# Patient Record
Sex: Female | Born: 1980 | Race: White | Hispanic: No | Marital: Married | Smoking: Former smoker
Health system: Southern US, Community
[De-identification: ages and names within clinical notes are randomized; demographics above are authoritative.]

## PROBLEM LIST (undated history)

## (undated) ENCOUNTER — Inpatient Hospital Stay (HOSPITAL_COMMUNITY): Payer: Self-pay

## (undated) DIAGNOSIS — J45909 Unspecified asthma, uncomplicated: Secondary | ICD-10-CM

## (undated) DIAGNOSIS — I10 Essential (primary) hypertension: Secondary | ICD-10-CM

## (undated) DIAGNOSIS — T4145XA Adverse effect of unspecified anesthetic, initial encounter: Secondary | ICD-10-CM

## (undated) HISTORY — DX: Unspecified asthma, uncomplicated: J45.909

## (undated) HISTORY — PX: CHOLECYSTECTOMY: SHX55

---

## 1998-06-03 ENCOUNTER — Other Ambulatory Visit: Admission: RE | Admit: 1998-06-03 | Discharge: 1998-06-03 | Payer: Self-pay | Admitting: Gynecology

## 1998-12-15 ENCOUNTER — Other Ambulatory Visit: Admission: RE | Admit: 1998-12-15 | Discharge: 1998-12-15 | Payer: Self-pay | Admitting: Gynecology

## 1999-04-10 ENCOUNTER — Other Ambulatory Visit: Admission: RE | Admit: 1999-04-10 | Discharge: 1999-04-10 | Payer: Self-pay | Admitting: Gynecology

## 1999-10-11 ENCOUNTER — Other Ambulatory Visit: Admission: RE | Admit: 1999-10-11 | Discharge: 1999-10-11 | Payer: Self-pay | Admitting: Obstetrics and Gynecology

## 1999-12-06 ENCOUNTER — Other Ambulatory Visit: Admission: RE | Admit: 1999-12-06 | Discharge: 1999-12-06 | Payer: Self-pay | Admitting: Obstetrics and Gynecology

## 2000-12-04 ENCOUNTER — Other Ambulatory Visit: Admission: RE | Admit: 2000-12-04 | Discharge: 2000-12-04 | Payer: Self-pay | Admitting: Gynecology

## 2001-07-12 ENCOUNTER — Encounter: Payer: Self-pay | Admitting: Emergency Medicine

## 2001-07-12 ENCOUNTER — Emergency Department (HOSPITAL_COMMUNITY): Admission: EM | Admit: 2001-07-12 | Discharge: 2001-07-12 | Payer: Self-pay | Admitting: Emergency Medicine

## 2001-12-11 ENCOUNTER — Other Ambulatory Visit: Admission: RE | Admit: 2001-12-11 | Discharge: 2001-12-11 | Payer: Self-pay | Admitting: Gynecology

## 2003-07-15 ENCOUNTER — Other Ambulatory Visit: Admission: RE | Admit: 2003-07-15 | Discharge: 2003-07-15 | Payer: Self-pay | Admitting: Gynecology

## 2005-05-14 DIAGNOSIS — I10 Essential (primary) hypertension: Secondary | ICD-10-CM

## 2005-05-14 HISTORY — DX: Essential (primary) hypertension: I10

## 2006-03-14 ENCOUNTER — Inpatient Hospital Stay (HOSPITAL_COMMUNITY): Admission: AD | Admit: 2006-03-14 | Discharge: 2006-03-15 | Payer: Self-pay | Admitting: Obstetrics & Gynecology

## 2006-04-12 ENCOUNTER — Inpatient Hospital Stay (HOSPITAL_COMMUNITY): Admission: AD | Admit: 2006-04-12 | Discharge: 2006-04-15 | Payer: Self-pay | Admitting: Obstetrics & Gynecology

## 2006-05-14 DIAGNOSIS — T8859XA Other complications of anesthesia, initial encounter: Secondary | ICD-10-CM

## 2006-05-14 HISTORY — DX: Other complications of anesthesia, initial encounter: T88.59XA

## 2006-06-13 ENCOUNTER — Ambulatory Visit (HOSPITAL_COMMUNITY): Admission: RE | Admit: 2006-06-13 | Discharge: 2006-06-13 | Payer: Self-pay | Admitting: Surgery

## 2006-06-13 IMAGING — RF DG CHOLANGIOGRAM OPERATIVE
1 series · 4 of 4 positions shown · non-contrast
Comparison: none

CLINICAL DATA: Biliary dyskinesia.

[Series 1: run · 4 of 60 frames shown]
[frame 8/60]
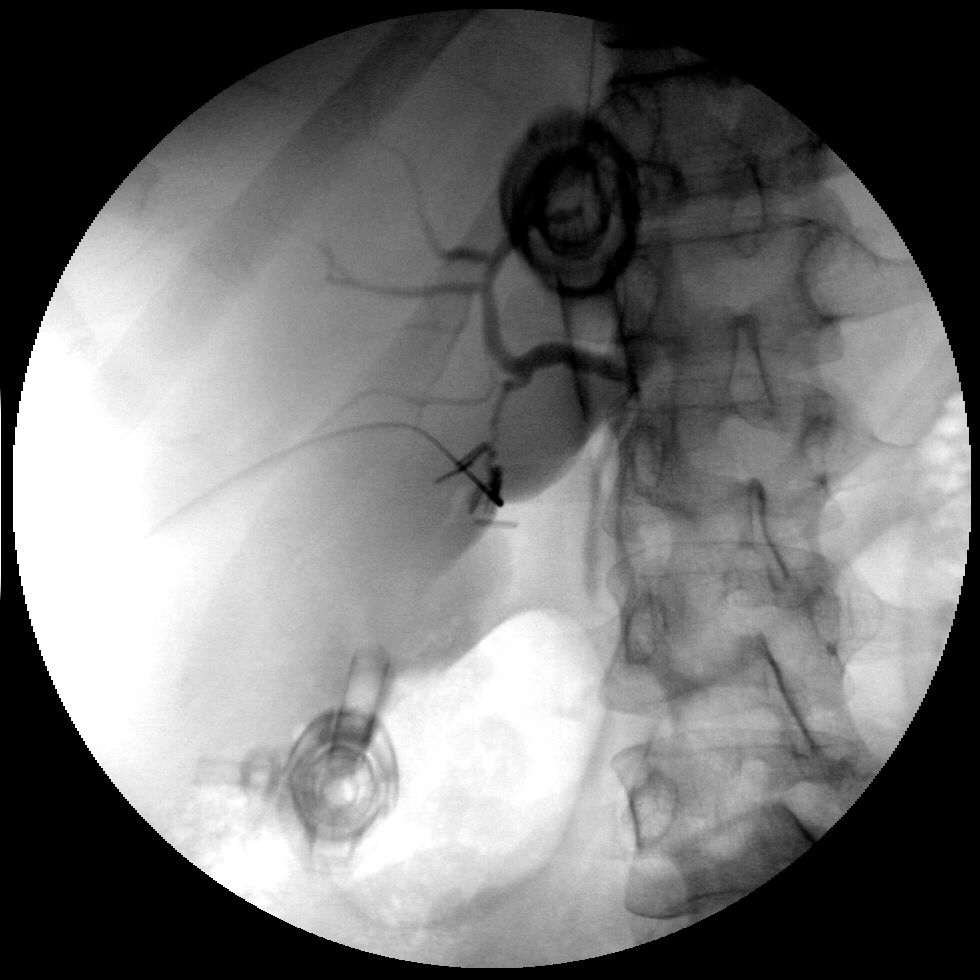
[frame 10/60]
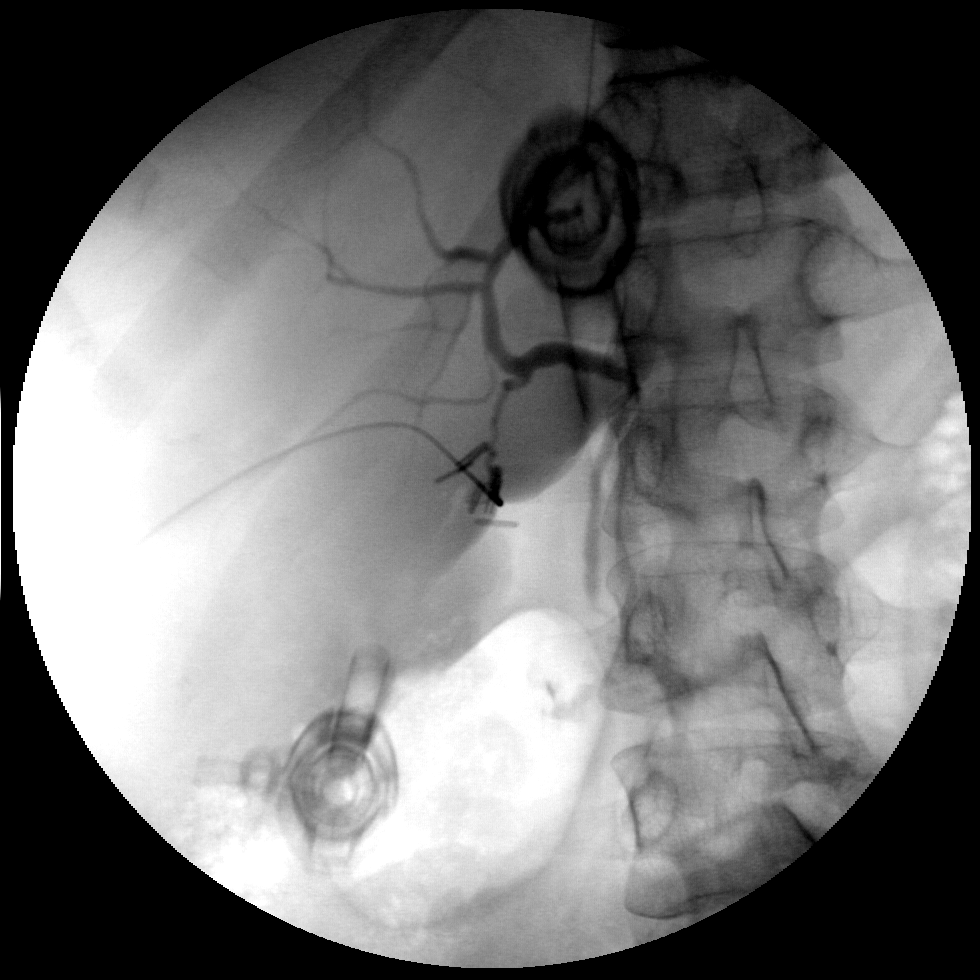
[frame 31/60]
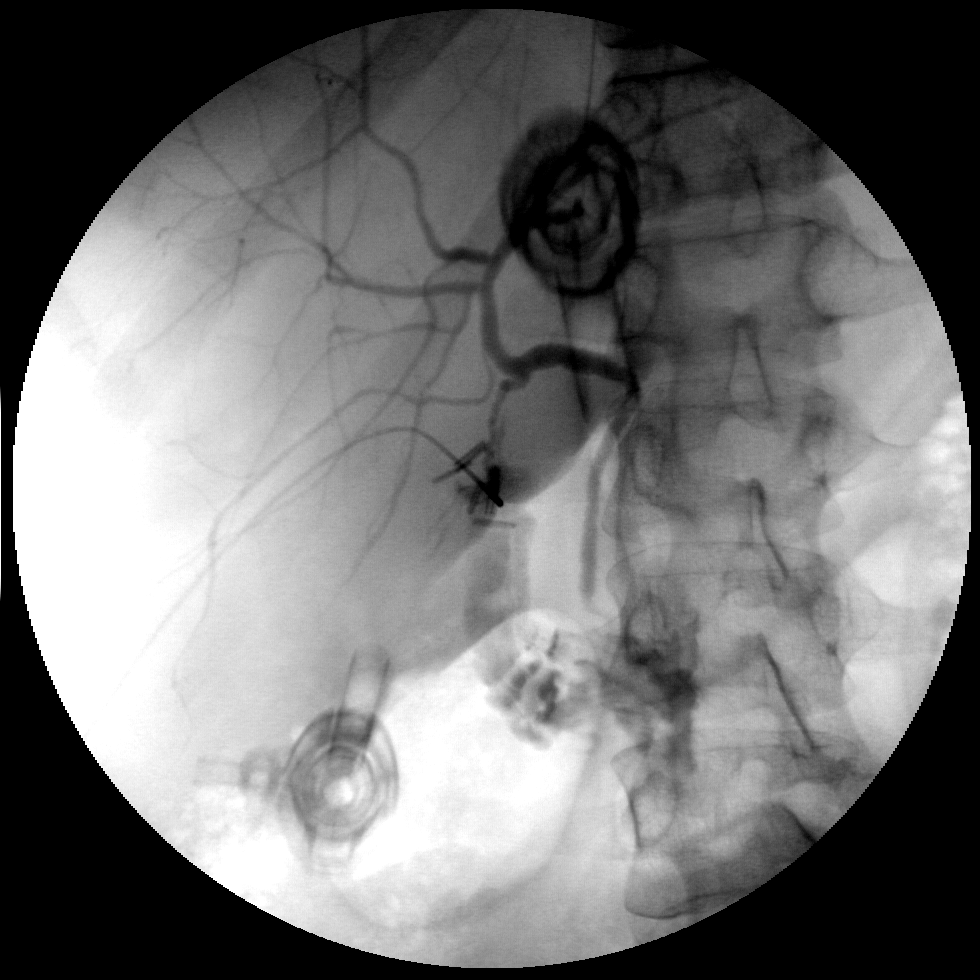
[frame 52/60]
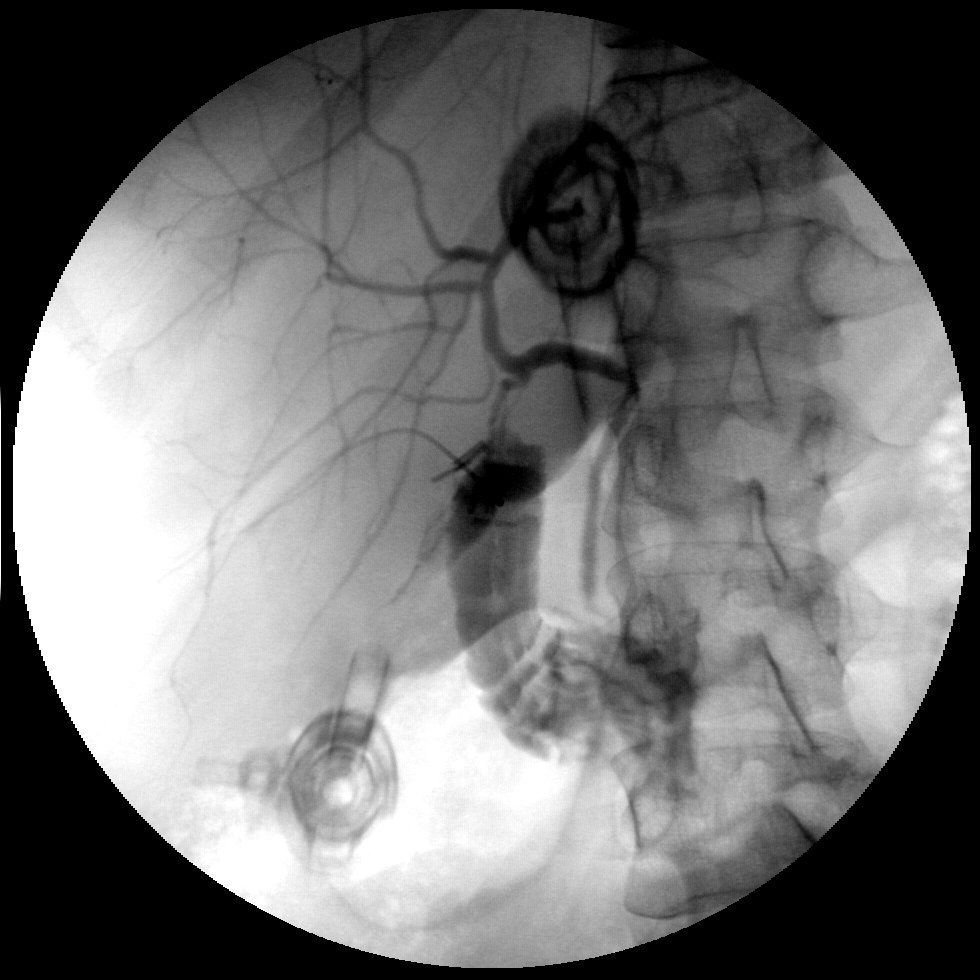

[4 of 4 positions shown; findings below may reference images not displayed]

INTRAOPERATIVE CHOLANGIOGRAM:

60  images from intraoperative C-arm fluoroscopy demonstrate  opacification of
the common bile duct. No filling defects to suggest retained stones. There is
incomplete evaluation of intrahepatic biliary tree, which appears decompressed
centrally. Contrast appears to flow on into decompressed duodenum.
IMPRESSION: 1. Negative for retained common duct stone

## 2010-12-27 ENCOUNTER — Other Ambulatory Visit: Payer: Self-pay | Admitting: Obstetrics & Gynecology

## 2012-03-10 ENCOUNTER — Other Ambulatory Visit (HOSPITAL_COMMUNITY): Payer: Self-pay | Admitting: Obstetrics & Gynecology

## 2012-03-10 DIAGNOSIS — N979 Female infertility, unspecified: Secondary | ICD-10-CM

## 2012-03-17 ENCOUNTER — Ambulatory Visit (HOSPITAL_COMMUNITY): Payer: Self-pay

## 2012-03-18 ENCOUNTER — Ambulatory Visit (HOSPITAL_COMMUNITY)
Admission: RE | Admit: 2012-03-18 | Discharge: 2012-03-18 | Disposition: A | Discharge status: Home / Self Care | Payer: BC Managed Care – PPO | Source: Ambulatory Visit | Attending: Obstetrics & Gynecology | Admitting: Obstetrics & Gynecology

## 2012-03-18 DIAGNOSIS — N979 Female infertility, unspecified: Secondary | ICD-10-CM | POA: Insufficient documentation

## 2012-03-18 MED ORDER — IOHEXOL 300 MG/ML  SOLN
30.0000 mL | Freq: Once | INTRAMUSCULAR | Status: AC | PRN
  Administered 2012-03-18: 30 mL

## 2014-03-08 LAB — OB RESULTS CONSOLE RUBELLA ANTIBODY, IGM: RUBELLA: UNDETERMINED

## 2014-03-08 LAB — OB RESULTS CONSOLE PLATELET COUNT: Platelets: 305 10*3/uL

## 2014-03-08 LAB — OB RESULTS CONSOLE ABO/RH: RH Type: POSITIVE

## 2014-03-08 LAB — OB RESULTS CONSOLE HGB/HCT, BLOOD
HCT: 34 %
Hemoglobin: 11.8 g/dL

## 2014-03-08 LAB — OB RESULTS CONSOLE ANTIBODY SCREEN: Antibody Screen: NEGATIVE

## 2014-03-08 LAB — OB RESULTS CONSOLE RPR: RPR: NONREACTIVE

## 2014-03-08 LAB — OB RESULTS CONSOLE HIV ANTIBODY (ROUTINE TESTING): HIV: NONREACTIVE

## 2014-03-08 LAB — OB RESULTS CONSOLE HEPATITIS B SURFACE ANTIGEN: HEP B S AG: NEGATIVE

## 2014-07-31 ENCOUNTER — Inpatient Hospital Stay (HOSPITAL_COMMUNITY)
Admission: AD | Admit: 2014-07-31 | Discharge: 2014-08-02 | DRG: 782 | Disposition: A | Discharge status: Home / Self Care | Payer: BC Managed Care – PPO | Source: Ambulatory Visit | Attending: Obstetrics | Admitting: Obstetrics

## 2014-07-31 ENCOUNTER — Encounter (HOSPITAL_COMMUNITY): Payer: Self-pay | Admitting: *Deleted

## 2014-07-31 DIAGNOSIS — Z822 Family history of deafness and hearing loss: Secondary | ICD-10-CM | POA: Diagnosis not present

## 2014-07-31 DIAGNOSIS — O09523 Supervision of elderly multigravida, third trimester: Secondary | ICD-10-CM | POA: Diagnosis not present

## 2014-07-31 DIAGNOSIS — Z823 Family history of stroke: Secondary | ICD-10-CM | POA: Diagnosis not present

## 2014-07-31 DIAGNOSIS — O139 Gestational [pregnancy-induced] hypertension without significant proteinuria, unspecified trimester: Secondary | ICD-10-CM | POA: Diagnosis not present

## 2014-07-31 DIAGNOSIS — O133 Gestational [pregnancy-induced] hypertension without significant proteinuria, third trimester: Secondary | ICD-10-CM | POA: Diagnosis present

## 2014-07-31 DIAGNOSIS — O3421 Maternal care for scar from previous cesarean delivery: Secondary | ICD-10-CM | POA: Diagnosis present

## 2014-07-31 DIAGNOSIS — Z3689 Encounter for other specified antenatal screening: Secondary | ICD-10-CM | POA: Insufficient documentation

## 2014-07-31 DIAGNOSIS — Z3A31 31 weeks gestation of pregnancy: Secondary | ICD-10-CM | POA: Diagnosis present

## 2014-07-31 DIAGNOSIS — R030 Elevated blood-pressure reading, without diagnosis of hypertension: Secondary | ICD-10-CM | POA: Diagnosis present

## 2014-07-31 DIAGNOSIS — Z8249 Family history of ischemic heart disease and other diseases of the circulatory system: Secondary | ICD-10-CM | POA: Diagnosis not present

## 2014-07-31 DIAGNOSIS — O169 Unspecified maternal hypertension, unspecified trimester: Secondary | ICD-10-CM | POA: Diagnosis present

## 2014-07-31 HISTORY — DX: Adverse effect of unspecified anesthetic, initial encounter: T41.45XA

## 2014-07-31 LAB — COMPREHENSIVE METABOLIC PANEL
ALBUMIN: 3.2 g/dL — AB (ref 3.5–5.2)
ALT: 16 U/L (ref 0–35)
AST: 17 U/L (ref 0–37)
Alkaline Phosphatase: 85 U/L (ref 39–117)
Anion gap: 8 (ref 5–15)
BUN: 5 mg/dL — ABNORMAL LOW (ref 6–23)
CALCIUM: 8.8 mg/dL (ref 8.4–10.5)
CO2: 23 mmol/L (ref 19–32)
Chloride: 106 mmol/L (ref 96–112)
Creatinine, Ser: 0.44 mg/dL — ABNORMAL LOW (ref 0.50–1.10)
GLUCOSE: 82 mg/dL (ref 70–99)
Potassium: 3.5 mmol/L (ref 3.5–5.1)
SODIUM: 137 mmol/L (ref 135–145)
Total Bilirubin: 0.4 mg/dL (ref 0.3–1.2)
Total Protein: 6.4 g/dL (ref 6.0–8.3)

## 2014-07-31 LAB — PROTEIN / CREATININE RATIO, URINE
Creatinine, Urine: <10 mg/dL
Total Protein, Urine: <6 mg/dL

## 2014-07-31 LAB — URINALYSIS, ROUTINE W REFLEX MICROSCOPIC
Bilirubin Urine: NEGATIVE
Glucose, UA: NEGATIVE mg/dL
Hgb urine dipstick: NEGATIVE
Ketones, ur: NEGATIVE mg/dL
Leukocytes, UA: NEGATIVE
Nitrite: NEGATIVE
Protein, ur: NEGATIVE mg/dL
Specific Gravity, Urine: <1.005 — ABNORMAL LOW (ref 1.005–1.030)
Urobilinogen, UA: 0.2 mg/dL (ref 0.0–1.0)
pH: 7 (ref 5.0–8.0)

## 2014-07-31 LAB — URIC ACID: URIC ACID, SERUM: 3.4 mg/dL (ref 2.4–7.0)

## 2014-07-31 LAB — CBC
HCT: 33.4 % — ABNORMAL LOW (ref 36.0–46.0)
Hemoglobin: 11.3 g/dL — ABNORMAL LOW (ref 12.0–15.0)
MCH: 30.6 pg (ref 26.0–34.0)
MCHC: 33.8 g/dL (ref 30.0–36.0)
MCV: 90.5 fL (ref 78.0–100.0)
PLATELETS: 272 10*3/uL (ref 150–400)
RBC: 3.69 MIL/uL — AB (ref 3.87–5.11)
RDW: 13.8 % (ref 11.5–15.5)
WBC: 10.5 10*3/uL (ref 4.0–10.5)

## 2014-07-31 LAB — LACTATE DEHYDROGENASE: LDH: 131 U/L (ref 94–250)

## 2014-07-31 MED ORDER — FAMOTIDINE 20 MG PO TABS: 20.0000 mg | ORAL_TABLET | Freq: Two times a day (BID) | ORAL | Status: DC | PRN

## 2014-07-31 MED ORDER — PRENATAL MULTIVITAMIN CH
1.0000 | ORAL_TABLET | Freq: Every day | ORAL | Status: DC
  Filled 2014-07-31: qty 1

## 2014-07-31 MED ORDER — CALCIUM CARBONATE ANTACID 500 MG PO CHEW: 2.0000 | CHEWABLE_TABLET | ORAL | Status: DC | PRN

## 2014-07-31 MED ORDER — BETAMETHASONE SOD PHOS & ACET 6 (3-3) MG/ML IJ SUSP
12.0000 mg | INTRAMUSCULAR | Status: AC
  Administered 2014-07-31 – 2014-08-01 (×2): 12 mg via INTRAMUSCULAR
  Filled 2014-07-31 (×2): qty 2

## 2014-07-31 MED ORDER — ACETAMINOPHEN 325 MG PO TABS: 650.0000 mg | ORAL_TABLET | ORAL | Status: DC | PRN

## 2014-07-31 MED ORDER — DOCUSATE SODIUM 100 MG PO CAPS
100.0000 mg | ORAL_CAPSULE | Freq: Every day | ORAL | Status: DC
Start: 2014-08-01 — End: 2014-07-31

## 2014-07-31 MED ORDER — DOCUSATE SODIUM 100 MG PO CAPS
100.0000 mg | ORAL_CAPSULE | Freq: Two times a day (BID) | ORAL | Status: DC | PRN
Start: 2014-07-31 — End: 2014-08-02
  Filled 2014-07-31: qty 1

## 2014-07-31 NOTE — MAU Note (Signed)
Having some problems with BP.  Not on any medications regularly for BP.  Nausea last night.  Headache today, little blurred vision, just not feeling well

## 2014-07-31 NOTE — MAU Provider Note (Signed)
History     CSN: [id number]  Arrival date and time: 07/31/14 1505   First Provider Initiated Contact with Patient 07/31/14 1556      Chief Complaint  Patient presents with  . Hypertension   HPI    Ms. Hayley [name] is a 34 y.o. female G2P1001 at 97w1dwho presents elevated BP readings; she checked her BP at home and it was elevated.   + fetal movement Denies vaginal bleeding or leaking of fluid.  Denies complication with this pregnancy. Desires a repeat cesarean section.   OB History    Gravida Para Term Preterm AB TAB SAB Ectopic Multiple Living   2 1 1  0  0 0 0 0 1      History reviewed. No pertinent past medical history.  Past Surgical History  Procedure Laterality Date  . Cholecystectomy      History reviewed. No pertinent family history.  History  Substance Use Topics  . Smoking status: Never Smoker   . Smokeless tobacco: Not on file  . Alcohol Use: No    Allergies: No Known Allergies  Prescriptions prior to admission  Medication Sig Dispense Refill Last Dose  . acetaminophen (TYLENOL) 160 MG/5ML liquid Take 325 mg by mouth every 4 (four) hours as needed for pain.   07/31/2014 at Unknown time  . Prenatal Vit-Fe Fumarate-FA (PRENATAL MULTIVITAMIN) TABS tablet Take 1 tablet by mouth daily at 12 noon.   07/31/2014 at Unknown time    Results for orders placed or performed during the hospital encounter of 07/31/14 (from the past 48 hour(s))  Urinalysis, Routine w reflex microscopic     Status: Abnormal   Collection Time: 07/31/14  3:15 PM  Result Value Ref Range   Color, Urine YELLOW YELLOW   APPearance CLEAR CLEAR   Specific Gravity, Urine <1.005 (L) 1.005 - 1.030   pH 7.0 5.0 - 8.0   Glucose, UA NEGATIVE NEGATIVE mg/dL   Hgb urine dipstick NEGATIVE NEGATIVE   Bilirubin Urine NEGATIVE NEGATIVE   Ketones, ur NEGATIVE NEGATIVE mg/dL   Protein, ur NEGATIVE NEGATIVE mg/dL   Urobilinogen, UA 0.2 0.0 - 1.0 mg/dL   Nitrite NEGATIVE NEGATIVE   Leukocytes, UA  NEGATIVE NEGATIVE    Comment: MICROSCOPIC NOT DONE ON URINES WITH NEGATIVE PROTEIN, BLOOD, LEUKOCYTES, NITRITE, OR GLUCOSE <1000 mg/dL.  Protein / creatinine ratio, urine     Status: None   Collection Time: 07/31/14  3:15 PM  Result Value Ref Range   Creatinine, Urine <10.00 mg/dL    Comment: REPEATED TO VERIFY   Total Protein, Urine <6.00 mg/dL    Comment: NO NORMAL RANGE ESTABLISHED FOR THIS TEST REPEATED TO VERIFY    Protein Creatinine Ratio        0.00 - 0.15    Comment: RESULT BELOW REPORTABLE RANGE, UNABLE TO CALCULATE.   CBC     Status: Abnormal   Collection Time: 07/31/14  4:10 PM  Result Value Ref Range   WBC 10.5 4.0 - 10.5 K/uL   RBC 3.69 (L) 3.87 - 5.11 MIL/uL   Hemoglobin 11.3 (L) 12.0 - 15.0 g/dL   HCT 33.4 (L) 36.0 - 46.0 %   MCV 90.5 78.0 - 100.0 fL   MCH 30.6 26.0 - 34.0 pg   MCHC 33.8 30.0 - 36.0 g/dL   RDW 13.8 11.5 - 15.5 %   Platelets 272 150 - 400 K/uL  Comprehensive metabolic panel     Status: Abnormal   Collection Time: 07/31/14  4:10 PM  Result Value Ref Range   Sodium 137 135 - 145 mmol/L   Potassium 3.5 3.5 - 5.1 mmol/L   Chloride 106 96 - 112 mmol/L   CO2 23 19 - 32 mmol/L   Glucose, Bld 82 70 - 99 mg/dL   BUN 5 (L) 6 - 23 mg/dL   Creatinine, Ser 0.44 (L) 0.50 - 1.10 mg/dL   Calcium 8.8 8.4 - 10.5 mg/dL   Total Protein 6.4 6.0 - 8.3 g/dL   Albumin 3.2 (L) 3.5 - 5.2 g/dL   AST 17 0 - 37 U/L   ALT 16 0 - 35 U/L   Alkaline Phosphatase 85 39 - 117 U/L   Total Bilirubin 0.4 0.3 - 1.2 mg/dL   GFR calc non Af Amer >90 >90 mL/min   GFR calc Af Amer >90 >90 mL/min    Comment: (NOTE) The eGFR has been calculated using the CKD EPI equation. This calculation has not been validated in all clinical situations. eGFR's persistently <90 mL/min signify possible Chronic Kidney Disease.    Anion gap 8 5 - 15  Uric acid     Status: None   Collection Time: 07/31/14  4:10 PM  Result Value Ref Range   Uric Acid, Serum 3.4 2.4 - 7.0 mg/dL  Lactate  dehydrogenase     Status: None   Collection Time: 07/31/14  4:10 PM  Result Value Ref Range   LDH 131 94 - 250 U/L    Review of Systems  Eyes: Negative for blurred vision.  Respiratory: Negative for sputum production.   Cardiovascular: Negative for chest pain and leg swelling.  Gastrointestinal: Positive for nausea.  Neurological: Positive for headaches.   Physical Exam   Blood pressure 164/97, pulse 83, temperature 97.1 F (36.2 C), temperature source Oral, resp. rate 18, last menstrual period 12/25/2013.    Filed Vitals:   07/31/14 1527  BP: 164/97  Pulse: 83  Temp: 97.1 F (36.2 C)  TempSrc: Oral  Resp: 18    Physical Exam  Constitutional: She is oriented to person, place, and time. She appears well-developed and well-nourished. No distress.  HENT:  Head: Normocephalic.  Eyes: Conjunctivae are normal. Pupils are equal, round, and reactive to light.  Neck: Neck supple.  Cardiovascular: Normal rate and normal heart sounds.   Respiratory: Effort normal.  GI: Soft. There is no tenderness.  Musculoskeletal: Normal range of motion.       Right ankle: She exhibits no swelling.       Left ankle: She exhibits no swelling.  Neurological: She is alert and oriented to person, place, and time. She has normal reflexes.  Negative clonus   Skin: Skin is warm. She is not diaphoretic.  Psychiatric: Her behavior is normal.    MAU Course  Procedures  None  MDM  NST UA Serial BP readings  CBC CMET Uric Acid LDH Protein/creatine ratio  Dr. Carlis Abbott notified of BP readings, and lab results.    Assessment and Plan   A:  Pregnancy induced hypertension; severe range   P:  Admit to Ante Betamethasone   Lezlie Lye, NP 07/31/2014 4:07 PM

## 2014-07-31 NOTE — H&P (Signed)
34 y.o. [id number] @ 373w1d presents with elevated BP.  She notes a mild HA and nausea yesterday.  Generally did not feel well.  Took her BP with her mother's BP cuff and it was "high."  When she still felt poorly today, she had a friend repeat her BP w a manual cuff and it was still elevated. BPs in MAU were 140-160/80-105.  Had a mild HA on admission to MAU, but it has resolved without medication.  Denies scotomata or RUQ pain.  No current nausea.  No h/o GHTN or preE with G1.  ? Mildly elevated BPs in the immediate PP period, but did not require oral antihypertensive therapy, readmission, or PP magnesium.  Otherwise has good fetal movement and no bleeding.    Pregnancy c/b:  1. AMA: NIPT low risk 46XX 2. History of prior cesarean delivery:  Arrest of descent, 6lb 5oz.  Plan is for RCS.   Past Medical History  Diagnosis Date  . Complication of anesthesia 2008    spinal not working, had general anesthesia    Past Surgical History  Procedure Laterality Date  . Cholecystectomy      OB History  Gravida Para Term Preterm AB SAB TAB Ectopic Multiple Living  4 1 1  0 2 2 0 0 0 0    # Outcome Date GA Lbr Len/2nd Weight Sex Delivery Anes PTL Lv  4 Current           3 SAB           2 SAB           1 Term               History   Social History  . Marital Status: Married    Spouse Name: N/A  . Number of Children: N/A  . Years of Education: N/A   Occupational History  . Not on file.   Social History Main Topics  . Smoking status: Never Smoker   . Smokeless tobacco: Not on file  . Alcohol Use: No  . Drug Use: No  . Sexual Activity: Yes   Other Topics Concern  . Not on file   Social History Narrative  . No narrative on file   Review of patient's allergies indicates no known allergies.    Prenatal Transfer Tool  Maternal Diabetes: No Genetic Screening: Normal Maternal Ultrasounds/Referrals: Normal Fetal Ultrasounds or other Referrals:  None Maternal Substance Abuse:   No Significant Maternal Medications:  None Significant Maternal Lab Results: None  ABO, Rh: O/Positive/-- (10/26 0000) Antibody: Negative (10/26 0000) Rubella:   Equivocal RPR: Nonreactive (10/26 0000)  HBsAg: Negative (10/26 0000)  HIV: Non-reactive (10/26 0000)  GBS:   unknown      Filed Vitals:   07/31/14 1745  BP: 145/87  Pulse: 81  Temp:   Resp:      General:  NAD Lungs: CTAB Cardiac: RRR Abdomen:  soft, gravid, no RUQ tenderness Ex:  no edema, DTRs 2+, no clonus SVE:  deferred FHTs:  135, mod var, + 15 x 15 accels, no decels Toco:  quiet   A/P   34 y.o.  [id number] @ 343w1d presents with elevated blood pressure Gestational hypertension vs preeclampsia: I suspect likely GHTN based on UA w neg pro (unable to calc u P:C) and otherwise unremarkable labs.  HA upon presentation was mild and has resolved without treatment.  At this time, BPs are moderately  elevated but not persistently severe.   Plan -Admit to  AP for serial BP monitoring.   -24 hr urine protein / CrCl -Daily CMP/CBC -If BPs > 160/110, will initiate antihypertensive therapy -Oral/IV antihypertensives not indicated at this time as BP not persistently >160 systolic or 110 diastolic -If severe features do not develop, delivery at 37 weeks  -Continuous fetal monitoring overnight.  If BPs remain mild range, will transition to daily NST in AM  -BMZ course now for prematurity. -If delivery is indicated at <32 weeks, plan magnesium sulfate for neuroprotection -NICU consult if delivery becomes imminent. -Growth [place] tomorrow, with f/u growth q3 weeks.    History of cesarean delivery: plan repeat.     [facility] GEFFEL [employer]

## 2014-07-31 NOTE — Plan of Care (Signed)
Problem: Consults Goal: Birthing Suites Patient Information Press F2 to bring up selections list   Pt < [redacted] weeks EGA     

## 2014-08-01 ENCOUNTER — Inpatient Hospital Stay (HOSPITAL_COMMUNITY): Payer: BC Managed Care – PPO

## 2014-08-01 LAB — CBC
HEMATOCRIT: 33.9 % — AB (ref 36.0–46.0)
Hemoglobin: 11.5 g/dL — ABNORMAL LOW (ref 12.0–15.0)
MCH: 30.5 pg (ref 26.0–34.0)
MCHC: 33.9 g/dL (ref 30.0–36.0)
MCV: 89.9 fL (ref 78.0–100.0)
Platelets: 279 10*3/uL (ref 150–400)
RBC: 3.77 MIL/uL — AB (ref 3.87–5.11)
RDW: 13.8 % (ref 11.5–15.5)
WBC: 15 10*3/uL — ABNORMAL HIGH (ref 4.0–10.5)

## 2014-08-01 LAB — COMPREHENSIVE METABOLIC PANEL
ALT: 16 U/L (ref 0–35)
AST: 17 U/L (ref 0–37)
Albumin: 3.1 g/dL — ABNORMAL LOW (ref 3.5–5.2)
Alkaline Phosphatase: 81 U/L (ref 39–117)
Anion gap: 8 (ref 5–15)
BUN: 9 mg/dL (ref 6–23)
CO2: 20 mmol/L (ref 19–32)
Calcium: 9 mg/dL (ref 8.4–10.5)
Chloride: 108 mmol/L (ref 96–112)
Creatinine, Ser: 0.47 mg/dL — ABNORMAL LOW (ref 0.50–1.10)
GFR calc non Af Amer: >90 mL/min (ref >90)
GLUCOSE: 112 mg/dL — AB (ref 70–99)
Potassium: 3.8 mmol/L (ref 3.5–5.1)
Sodium: 136 mmol/L (ref 135–145)
Total Bilirubin: 0.2 mg/dL — ABNORMAL LOW (ref 0.3–1.2)
Total Protein: 6.7 g/dL (ref 6.0–8.3)

## 2014-08-01 LAB — TYPE AND SCREEN
ABO/RH(D): O POS
Antibody Screen: NEGATIVE

## 2014-08-01 LAB — PROTEIN, URINE, 24 HOUR
Collection Interval-UPROT: 24 hours
PROTEIN 24H UR: 247 mg/d — AB (ref <150)
Protein, Urine: 8 mg/dL (ref 5–24)
Urine Total Volume-UPROT: 3085 mL

## 2014-08-01 LAB — ABO/RH: ABO/RH(D): O POS

## 2014-08-01 NOTE — Progress Notes (Signed)
34 y.o. G4P1020 [redacted]w[redacted]d HD#1 admitted for 31 WKS, HIGH BP.  Pt currently stable with no c/o.  Good FM.  Filed Vitals:   07/31/14 1952 07/31/14 2151 07/31/14 2313 08/01/14 0727  BP: 154/91 140/91 155/87 129/74  Pulse: 90 96 86 83  Temp:  98.1 F (36.7 C)  98.2 F (36.8 C)  TempSrc:    Oral  Resp:  Height:      Weight:        Lungs CTA Cor RRR Abd  Soft, gravid, nontender Ex SCDs FHTs  120s, good short term variability, NST R Toco  occ  Results for orders placed or performed during the hospital encounter of 07/31/14 (from the past 24 hour(s))  Urinalysis, Routine w reflex microscopic     Status: Abnormal   Collection Time: 07/31/14  3:15 PM  Result Value Ref Range   Color, Urine YELLOW YELLOW   APPearance CLEAR CLEAR   Specific Gravity, Urine <1.005 (L) 1.005 - 1.030   pH 7.0 5.0 - 8.0   Glucose, UA NEGATIVE NEGATIVE mg/dL   Hgb urine dipstick NEGATIVE NEGATIVE   Bilirubin Urine NEGATIVE NEGATIVE   Ketones, ur NEGATIVE NEGATIVE mg/dL   Protein, ur NEGATIVE NEGATIVE mg/dL   Urobilinogen, UA 0.2 0.0 - 1.0 mg/dL   Nitrite NEGATIVE NEGATIVE   Leukocytes, UA NEGATIVE NEGATIVE  Protein / creatinine ratio, urine     Status: None   Collection Time: 07/31/14  3:15 PM  Result Value Ref Range   Creatinine, Urine <10.00 mg/dL   Total Protein, Urine <6.00 mg/dL   Protein Creatinine Ratio        0.00 - 0.15  CBC     Status: Abnormal   Collection Time: 07/31/14  4:10 PM  Result Value Ref Range   WBC 10.5 4.0 - 10.5 K/uL   RBC 3.69 (L) 3.87 - 5.11 MIL/uL   Hemoglobin 11.3 (L) 12.0 - 15.0 g/dL   HCT 16.1 (L) 09.6 - 04.5 %   MCV 90.5 78.0 - 100.0 fL   MCH 30.6 26.0 - 34.0 pg   MCHC 33.8 30.0 - 36.0 g/dL   RDW 40.9 81.1 - 91.4 %   Platelets 272 150 - 400 K/uL  Comprehensive metabolic panel     Status: Abnormal   Collection Time: 07/31/14  4:10 PM  Result Value Ref Range   Sodium 137 135 - 145 mmol/L   Potassium 3.5 3.5 - 5.1 mmol/L   Chloride 106 96 - 112 mmol/L    CO2 23 19 - 32 mmol/L   Glucose, Bld 82 70 - 99 mg/dL   BUN 5 (L) 6 - 23 mg/dL   Creatinine, Ser 7.82 (L) 0.50 - 1.10 mg/dL   Calcium 8.8 8.4 - 95.6 mg/dL   Total Protein 6.4 6.0 - 8.3 g/dL   Albumin 3.2 (L) 3.5 - 5.2 g/dL   AST 17 0 - 37 U/L   ALT 16 0 - 35 U/L   Alkaline Phosphatase 85 39 - 117 U/L   Total Bilirubin 0.4 0.3 - 1.2 mg/dL   GFR calc non Af Amer >90 >90 mL/min   GFR calc Af Amer >90 >90 mL/min   Anion gap 8 5 - 15  Uric acid     Status: None   Collection Time: 07/31/14  4:10 PM  Result Value Ref Range   Uric Acid, Serum 3.4 2.4 - 7.0 mg/dL  Lactate dehydrogenase     Status: None   Collection Time: 07/31/14  4:10 PM  Result Value Ref Range   LDH 131 94 - 250 U/L  Comprehensive metabolic panel     Status: Abnormal   Collection Time: 08/01/14  6:17 AM  Result Value Ref Range   Sodium 136 135 - 145 mmol/L   Potassium 3.8 3.5 - 5.1 mmol/L   Chloride 108 96 - 112 mmol/L   CO2 20 19 - 32 mmol/L   Glucose, Bld 112 (H) 70 - 99 mg/dL   BUN 9 6 - 23 mg/dL   Creatinine, Ser 1.020.47 (L) 0.50 - 1.10 mg/dL   Calcium 9.0 8.4 - 72.510.5 mg/dL   Total Protein 6.7 6.0 - 8.3 g/dL   Albumin 3.1 (L) 3.5 - 5.2 g/dL   AST 17 0 - 37 U/L   ALT 16 0 - 35 U/L   Alkaline Phosphatase 81 39 - 117 U/L   Total Bilirubin 0.2 (L) 0.3 - 1.2 mg/dL   GFR calc non Af Amer >90 >90 mL/min   GFR calc Af Amer >90 >90 mL/min   Anion gap 8 5 - 15  CBC     Status: Abnormal   Collection Time: 08/01/14  6:17 AM  Result Value Ref Range   WBC 15.0 (H) 4.0 - 10.5 K/uL   RBC 3.77 (L) 3.87 - 5.11 MIL/uL   Hemoglobin 11.5 (L) 12.0 - 15.0 g/dL   HCT 36.633.9 (L) 44.036.0 - 34.746.0 %   MCV 89.9 78.0 - 100.0 fL   MCH 30.5 26.0 - 34.0 pg   MCHC 33.9 30.0 - 36.0 g/dL   RDW 42.513.8 95.611.5 - 38.715.5 %   Platelets 279 150 - 400 K/uL  Type and screen     Status: None   Collection Time: 08/01/14  6:17 AM  Result Value Ref Range   ABO/RH(D) O POS    Antibody Screen NEG    Sample Expiration 08/04/2014    [name]:  HD#1  [id number] with  PIH.  P: Pt has no signs of preeclampsia. BPs mostly stable with bedrest.  Will continue to observe and finish BMZ course.  MFM consult NOT yet ordered- may desire tomorrow.  US pending for EFW and AFI.  Hayley [name]

## 2014-08-01 NOTE — Progress Notes (Signed)
US vtx, 3#14, 40%ile with AFI 18. 24 hour urine also in progress- up at 7 pm tonight.

## 2014-08-02 ENCOUNTER — Inpatient Hospital Stay (HOSPITAL_COMMUNITY): Payer: BC Managed Care – PPO

## 2014-08-02 DIAGNOSIS — Z3689 Encounter for other specified antenatal screening: Secondary | ICD-10-CM | POA: Insufficient documentation

## 2014-08-02 DIAGNOSIS — Z3A31 31 weeks gestation of pregnancy: Secondary | ICD-10-CM | POA: Insufficient documentation

## 2014-08-02 DIAGNOSIS — O133 Gestational [pregnancy-induced] hypertension without significant proteinuria, third trimester: Secondary | ICD-10-CM | POA: Insufficient documentation

## 2014-08-02 LAB — CREATININE CLEARANCE, URINE, 24 HOUR
CREAT CLEAR: 197 mL/min — AB (ref 75–115)
CREATININE 24H UR: 1332 mg/d (ref 700–1800)
Collection Interval-CRCL: 24 hours
Creatinine, Urine: 43.18 mg/dL
Urine Total Volume-CRCL: 3085 mL

## 2014-08-02 LAB — CBC
HCT: 32.3 % — ABNORMAL LOW (ref 36.0–46.0)
HEMOGLOBIN: 11.2 g/dL — AB (ref 12.0–15.0)
MCH: 31.4 pg (ref 26.0–34.0)
MCHC: 34.7 g/dL (ref 30.0–36.0)
MCV: 90.5 fL (ref 78.0–100.0)
Platelets: 285 10*3/uL (ref 150–400)
RBC: 3.57 MIL/uL — AB (ref 3.87–5.11)
RDW: 14 % (ref 11.5–15.5)
WBC: 17.8 10*3/uL — ABNORMAL HIGH (ref 4.0–10.5)

## 2014-08-02 LAB — COMPREHENSIVE METABOLIC PANEL
ALT: 14 U/L (ref 0–35)
AST: 15 U/L (ref 0–37)
Albumin: 3.1 g/dL — ABNORMAL LOW (ref 3.5–5.2)
Alkaline Phosphatase: 73 U/L (ref 39–117)
Anion gap: 9 (ref 5–15)
BUN: 8 mg/dL (ref 6–23)
CALCIUM: 8.7 mg/dL (ref 8.4–10.5)
CHLORIDE: 107 mmol/L (ref 96–112)
CO2: 20 mmol/L (ref 19–32)
Creatinine, Ser: 0.44 mg/dL — ABNORMAL LOW (ref 0.50–1.10)
GFR calc Af Amer: >90 mL/min (ref >90)
Glucose, Bld: 121 mg/dL — ABNORMAL HIGH (ref 70–99)
Potassium: 3.8 mmol/L (ref 3.5–5.1)
Sodium: 136 mmol/L (ref 135–145)
Total Bilirubin: 0.1 mg/dL — ABNORMAL LOW (ref 0.3–1.2)
Total Protein: 6.3 g/dL (ref 6.0–8.3)

## 2014-08-02 MED ORDER — NIFEDIPINE ER OSMOTIC RELEASE 30 MG PO TB24: 30.0000 mg | ORAL_TABLET | Freq: Every day | ORAL | Status: DC

## 2014-08-02 NOTE — Discharge Instructions (Signed)
Hypertension During Pregnancy °Hypertension, or high blood pressure, is when there is extra pressure inside your blood vessels that carry blood from the heart to the rest of your body (arteries). It can happen at any time in life, including pregnancy. Hypertension during pregnancy can cause problems for you and your baby. Your baby might not weigh as much as he or she should at birth or might be born early (premature). Very bad cases of hypertension during pregnancy can be life-threatening.  °Different types of hypertension can occur during pregnancy. These include: °· Chronic hypertension. This happens when a woman has hypertension before pregnancy and it continues during pregnancy. °· Gestational hypertension. This is when hypertension develops during pregnancy. °· Preeclampsia or toxemia of pregnancy. This is a very serious type of hypertension that develops only during pregnancy. It affects the whole body and can be very dangerous for both mother and baby.   °Gestational hypertension and preeclampsia usually go away after your baby is born. Your blood pressure will likely stabilize within 6 weeks. Women who have hypertension during pregnancy have a greater chance of developing hypertension later in life or with future pregnancies. °RISK FACTORS °There are certain factors that make it more likely for you to develop hypertension during pregnancy. These include: °· Having hypertension before pregnancy. °· Having hypertension during a previous pregnancy. °· Being overweight. °· Being older than 40 years. °· Being pregnant with more than one baby. °· Having diabetes or kidney problems. °SIGNS AND SYMPTOMS °Chronic and gestational hypertension rarely cause symptoms. Preeclampsia has symptoms, which may include: °· Increased protein in your urine. Your health care provider will check for this at every prenatal visit. °· Swelling of your hands and face. °· Rapid weight gain. °· Headaches. °· Visual changes. °· Being  bothered by light. °· Abdominal pain, especially in the upper right area. °· Chest pain. °· Shortness of breath. °· Increased reflexes. °· Seizures. These occur with a more severe form of preeclampsia, called eclampsia. °DIAGNOSIS  °You may be diagnosed with hypertension during a regular prenatal exam. At each prenatal visit, you may have: °· Your blood pressure checked. °· A urine test to check for protein in your urine. °The type of hypertension you are diagnosed with depends on when you developed it. It also depends on your specific blood pressure reading. °· Developing hypertension before 20 weeks of pregnancy is consistent with chronic hypertension. °· Developing hypertension after 20 weeks of pregnancy is consistent with gestational hypertension. °· Hypertension with increased urinary protein is diagnosed as preeclampsia. °· Blood pressure measurements that stay above 160 systolic or 110 diastolic are a sign of severe preeclampsia. °TREATMENT °Treatment for hypertension during pregnancy varies. Treatment depends on the type of hypertension and how serious it is. °· If you take medicine for chronic hypertension, you may need to switch medicines. °¨ Medicines called ACE inhibitors should not be taken during pregnancy. °¨ Low-dose aspirin may be suggested for women who have risk factors for preeclampsia. °· If you have gestational hypertension, you may need to take a blood pressure medicine that is safe during pregnancy. Your health care provider will recommend the correct medicine. °· If you have severe preeclampsia, you may need to be in the hospital. Health care providers will watch you and your baby very closely. You also may need to take medicine called magnesium sulfate to prevent seizures and lower blood pressure. °· Sometimes, an early delivery is needed. This may be the case if the condition worsens. It would be   done to protect you and your baby. The only cure for preeclampsia is delivery. °· Your health  care provider may recommend that you take one low-dose aspirin (81 mg) each day to help prevent high blood pressure during your pregnancy if you are at risk for preeclampsia. You may be at risk for preeclampsia if: °¨ You had preeclampsia or eclampsia during a previous pregnancy. °¨ Your baby did not grow as expected during a previous pregnancy. °¨ You experienced preterm birth with a previous pregnancy. °¨ You experienced a separation of the placenta from the uterus (placental abruption) during a previous pregnancy. °¨ You experienced the loss of your baby during a previous pregnancy. °¨ You are pregnant with more than one baby. °¨ You have other medical conditions, such as diabetes or an autoimmune disease. °HOME CARE INSTRUCTIONS °· Schedule and keep all of your regular prenatal care appointments. This is important. °· Take medicines only as directed by your health care provider. Tell your health care provider about all medicines you take. °· Eat as little salt as possible. °· Get regular exercise. °· Do not drink alcohol. °· Do not use tobacco products. °· Do not drink products with caffeine. °· Lie on your left side when resting. °SEEK IMMEDIATE MEDICAL CARE IF: °· You have severe abdominal pain. °· You have sudden swelling in your hands, ankles, or face. °· You gain 4 pounds (1.8 kg) or more in 1 week. °· You vomit repeatedly. °· You have vaginal bleeding. °· You do not feel your baby moving as much. °· You have a headache. °· You have blurred or double vision. °· You have muscle twitching or spasms. °· You have shortness of breath. °· You have blue fingernails or lips. °· You have blood in your urine. °MAKE SURE YOU: °· Understand these instructions. °· Will watch your condition. °· Will get help right away if you are not doing well or get worse. °Document Released: 01/16/2011 Document Revised: 09/14/2013 Document Reviewed: 11/27/2012 °Exit[employer]® Patient Information ©2015 [employer]. This information is not  intended to replace advice given to you by your health care provider. Make sure you discuss any questions you have with your health care provider. ° °Preeclampsia and Eclampsia °Preeclampsia is a serious condition that develops only during pregnancy. It is also called toxemia of pregnancy. This condition causes high blood pressure along with other symptoms, such as swelling and headaches. These may develop as the condition gets worse. Preeclampsia may occur 20 weeks or later into your pregnancy.  °Diagnosing and treating preeclampsia early is very important. If not treated early, it can cause serious problems for you and your baby. One problem it can lead to is eclampsia, which is a condition that causes muscle jerking or shaking (convulsions) in the mother. Delivering your baby is the best treatment for preeclampsia or eclampsia.  °RISK FACTORS °The cause of preeclampsia is not known. You may be more likely to develop preeclampsia if you have certain risk factors. These include:  °· Being pregnant for the first time. °· Having preeclampsia in a past pregnancy. °· Having a family history of preeclampsia. °· Having high blood pressure. °· Being pregnant with twins or triplets. °· Being 35 or older. °· Being African American. °· Having kidney disease or diabetes. °· Having medical conditions such as lupus or blood diseases. °· Being very overweight (obese). °SIGNS AND SYMPTOMS  °The earliest signs of preeclampsia are: °· High blood pressure. °· Increased protein in your urine. Your health care   provider will check for this at every prenatal visit. °Other symptoms that can develop include:  °· Severe headaches. °· Sudden weight gain. °· Swelling of your hands, face, legs, and feet. °· Feeling sick to your stomach (nauseous) and throwing up (vomiting). °· Vision problems (blurred or double vision). °· Numbness in your face, arms, legs, and feet. °· Dizziness. °· Slurred speech. °· Sensitivity to bright  lights. °· Abdominal pain. °DIAGNOSIS  °There are no screening tests for preeclampsia. Your health care provider will ask you about symptoms and check for signs of preeclampsia during your prenatal visits. You may also have tests, including: °· Urine testing. °· Blood testing. °· Checking your baby's heart rate. °· Checking the health of your baby and your placenta using images created with sound waves (ultrasound). °TREATMENT  °You can work out the best treatment approach together with your health care provider. It is very important to keep all prenatal appointments. If you have an increased risk of preeclampsia, you may need more frequent prenatal exams. °· Your health care provider may prescribe bed rest. °· You may have to eat as little salt as possible. °· You may need to take medicine to lower your blood pressure if the condition does not respond to more conservative measures. °· You may need to stay in the hospital if your condition is severe. There, treatment will focus on controlling your blood pressure and fluid retention. You may also need to take medicine to prevent seizures. °· If the condition gets worse, your baby may need to be delivered early to protect you and the baby. You may have your labor started with medicine (be induced), or you may have a cesarean delivery. °· Preeclampsia usually goes away after the baby is born. °HOME CARE INSTRUCTIONS  °· Only take over-the-counter or prescription medicines as directed by your health care provider. °· Lie on your left side while resting. This keeps pressure off your baby. °· Elevate your feet while resting. °· Get regular exercise. Ask your health care provider what type of exercise is safe for you. °· Avoid caffeine and alcohol. °· Do not smoke. °· Drink 6-8 glasses of water every day. °· Eat a balanced diet that is low in salt. Do not add salt to your food. °· Avoid stressful situations as much as possible. °· Get plenty of rest and sleep. °· Keep all  prenatal appointments and tests as scheduled. °SEEK MEDICAL CARE IF: °· You are gaining more weight than expected. °· You have any headaches, abdominal pain, or nausea. °· You are bruising more than usual. °· You feel dizzy or light-headed. °SEEK IMMEDIATE MEDICAL CARE IF:  °· You develop sudden or severe swelling anywhere in your body. This usually happens in the legs. °· You gain 5 lb (2.3 kg) or more in a week. °· You have a severe headache, dizziness, problems with your vision, or confusion. °· You have severe abdominal pain. °· You have lasting nausea or vomiting. °· You have a seizure. °· You have trouble moving any part of your body. °· You develop numbness in your body. °· You have trouble speaking. °· You have any abnormal bleeding. °· You develop a stiff neck. °· You pass out. °MAKE SURE YOU:  °· Understand these instructions. °· Will watch your condition. °· Will get help right away if you are not doing well or get worse. °Document Released: 04/27/2000 Document Revised: 05/05/2013 Document Reviewed: 02/20/2013 °ExitCare® Patient Information ©2015 [employer]. This information is not   intended to replace advice given to you by your health care provider. Make sure you discuss any questions you have with your health care provider. ° °

## 2014-08-02 NOTE — Progress Notes (Signed)
RN spoke w/MD. Per MD, pt okay to discharge following MFM MD consult.

## 2014-08-02 NOTE — Consult Note (Signed)
Maternal Fetal Medicine Consultation  Requesting Provider(s): Essie Hart, MD   Reason for consultation: Gestational hypertension, recommendations for management  HPI: Hayley [name]  Is a 34 yo [id number], EDD 10/01/2014 who is currently at 31w 3d who was recently admitted due to elevated blood pressures- seen in consultation for management recommendations.  Hayley [name] denies any history of chronic hypertension outside of pregnancy - noted during her clinic visits last week to have some elevated blood pressures.  She was admitted over the weekend for a course of betamethasone and observation.  Her preeclampsia labs were within normal limits.  Her 24-hr urine protein returned at 247 mg/24 hrs.  Admission ultrasound showed an estimated fetal weight at the 54th %tile with normal amniotic fluid volume.  At the time of admission, she had some intermittent severe range blood pressures (150-160/105-115) that have since improved on bedrest and she has not required any antihypertensive medications.  Over the last 12-24 hrs, her blood pressures have mostly been it the 135-140/70-90 range.  She had some headaches on admission that have since resolved.  She denies visual changes or RUQ pain.  The fetus is active.  Her prenatal course has otherwise been uncomplicated.  Hayley [name] reports that she had some elevated blood pressures following the delivery of her last child, but did not require admission or antihypertensive medications.  Her past OB history is remarkable for a previous term C-section due to non-reassuring fetal status and two early SABs.  OB History: OB History    Gravida Para Term Preterm AB TAB SAB Ectopic Multiple Living   0 2 0 2 0 0 0      PMH:  Past Medical History  Diagnosis Date  . Complication of anesthesia 2008    spinal not working, had general anesthesia    PSH:  Past Surgical History  Procedure Laterality Date  . Cholecystectomy     Meds:  Scheduled Meds: . prenatal  multivitamin  1 tablet Oral Q1200   Continuous Infusions:  PRN Meds:.acetaminophen, calcium carbonate, docusate sodium, famotidine   Allergies: No Known Allergies   FH:  Family History  Problem Relation Age of Onset  . Hearing loss Mother   . Hypertension Mother   . Cancer Father   . Alcohol abuse Brother   . Cancer Paternal Aunt   . Arthritis Maternal Grandmother   . Cancer Maternal Grandmother   . Heart disease Maternal Grandmother   . Hypertension Maternal Grandmother   . Stroke Maternal Grandmother    Soc:  History   Social History  . Marital Status: Married    Spouse Name: N/A  . Number of Children: N/A  . Years of Education: N/A   Occupational History  . Not on file.   Social History Main Topics  . Smoking status: Never Smoker   . Smokeless tobacco: Not on file  . Alcohol Use: No  . Drug Use: No  . Sexual Activity: Yes   Other Topics Concern  . Not on file   Social History Narrative  . No narrative on file    Review of Systems: no vaginal bleeding or cramping/contractions, no LOF, no nausea/vomiting. All other systems reviewed and are negative.  PNL:   PE:   Filed Vitals:   08/02/14 1207  BP: 144/80  Pulse: 85  Temp: 98.2 F (36.8 C)  Resp: 18    GEN: well-appearing female ABD: gravid, NT  Labs: CBC    Component Value Date/Time   WBC  17.8* 08/02/2014 0615   RBC 3.57* 08/02/2014 0615   HGB 11.2* 08/02/2014 0615   HGB 11.8 03/08/2014   HCT 32.3* 08/02/2014 0615   HCT 34 03/08/2014   PLT 285 08/02/2014 0615   PLT 305 03/08/2014   MCV 90.5 08/02/2014 0615   MCH 31.4 08/02/2014 0615   MCHC 34.7 08/02/2014 0615   RDW 14.0 08/02/2014 0615   CMP     Component Value Date/Time   NA 136 08/02/2014 0615   K 3.8 08/02/2014 0615   CL 107 08/02/2014 0615   CO2 20 08/02/2014 0615   GLUCOSE 121* 08/02/2014 0615   BUN 8 08/02/2014 0615   CREATININE 0.44* 08/02/2014 0615   CALCIUM 8.7 08/02/2014 0615   PROT 6.3 08/02/2014 0615   ALBUMIN  3.1* 08/02/2014 0615   AST 15 08/02/2014 0615   ALT 14 08/02/2014 0615   ALKPHOS 73 08/02/2014 0615   BILITOT 0.1* 08/02/2014 0615   GFRNONAA >90 08/02/2014 0615   GFRAA >90 08/02/2014 0615   A/P: 1) Single IUP at 31w 3d         2) Gestational hypertension - BPs much improved over the last 12-24 hrs, but did have some labile and severe range blood pressures at the time of admission.  Preeclampsia labs are within normal limits.  Recommendations: 1) Given severe range BPs on admission (now improved) - would recommend beginning antihypertensive medications.  My preference would be Procardia XL 30 mg daily. 2) Would feel comfortable discharging home, but would recommend follow up in the clinic tomorrow for BP check and close outpatient follow up thereafter. 3) If BPs continue to be elevated despite low dose procardia, would recommend readmission to the hospital for closer evaluation.  Would also recommend readmission for worsening laboratory values worsening clinical features. 4) Recommend 2x weekly NSTs with BPs checks 5) Ultrasounds for interval growth every 3-4 weeks 6) Weekly preeclampsia labs; consider repeating 24-hr urine protein as an outpatient next week. 7) If the patient remains stable, would recommend delivery at 37 weeks for Gestational hypertension.   Thank you for the opportunity to be a part of the care of Hayley [name]. Please contact our office if we can be of further assistance.   I spent approximately 30 minutes with this patient with over 50% of time spent in face-to-face counseling.  Alpha GulaPaul Lashundra Shiveley, MD Maternal Fetal Medicine

## 2014-08-02 NOTE — Progress Notes (Signed)
Name: Hayley MiyamotoMary R [name] Medical Record Number:  [id number] Date of Birth: 11/08/1980 Date of Service: 08/02/2014  34 y.o. [id number] [id number] HD#2 admitted for 31 WKS, HIGH BP.  Pt currently stable with no complaints.  She notes resolution of her HA, no blurry vision, No RUQ pain.   She denies contractions, no vaginal bleeding, no leaking of fluid. Reports good FM.  The patient's past medical history and prenatal records were reviewed.  Additional issues addressed and updated today: Patient Active Problem List   Diagnosis Date Noted  . Hypertension affecting pregnancy, antepartum 07/31/2014   Family History  Problem Relation Age of Onset  . Hearing loss Mother   . Hypertension Mother   . Cancer Father   . Alcohol abuse Brother   . Cancer Paternal Aunt   . Arthritis Maternal Grandmother   . Cancer Maternal Grandmother   . Heart disease Maternal Grandmother   . Hypertension Maternal Grandmother   . Stroke Maternal Grandmother    History   Social History  . Marital Status: Married    Spouse Name: N/A  . Number of Children: N/A  . Years of Education: N/A   Social History Main Topics  . Smoking status: Never Smoker   . Smokeless tobacco: Not on file  . Alcohol Use: No  . Drug Use: No  . Sexual Activity: Yes   Other Topics Concern  . None   Social History Narrative  . None   Filed Vitals:   08/02/14 0822  BP: 140/94  Pulse: 84  Temp: 97.7 F (36.5 C)  Resp: 18     Physical Examination:   Filed Vitals:   08/02/14 0822  BP: 140/94  Pulse: 84  Temp: 97.7 F (36.5 C)  Resp: 18    FHTs  150s, moderate variability accels no decels Toco  none  Cervix: not evaluated  Results for orders placed or performed during the hospital encounter of 07/31/14 (from the past 24 hour(s))  Comprehensive metabolic panel     Status: Abnormal   Collection Time: 08/02/14  6:15 AM  Result Value Ref Range   Sodium 136 135 - 145 mmol/L   Potassium 3.8 3.5 - 5.1 mmol/L   Chloride 107 96  - 112 mmol/L   CO2 20 19 - 32 mmol/L   Glucose, Bld 121 (H) 70 - 99 mg/dL   BUN 8 6 - 23 mg/dL   Creatinine, Ser 4.090.44 (L) 0.50 - 1.10 mg/dL   Calcium 8.7 8.4 - 81.110.5 mg/dL   Total Protein 6.3 6.0 - 8.3 g/dL   Albumin 3.1 (L) 3.5 - 5.2 g/dL   AST 15 0 - 37 U/L   ALT 14 0 - 35 U/L   Alkaline Phosphatase 73 39 - 117 U/L   Total Bilirubin 0.1 (L) 0.3 - 1.2 mg/dL   GFR calc non Af Amer >90 >90 mL/min   GFR calc Af Amer >90 >90 mL/min   Anion gap 9 5 - 15  CBC     Status: Abnormal   Collection Time: 08/02/14  6:15 AM  Result Value Ref Range   WBC 17.8 (H) 4.0 - 10.5 K/uL   RBC 3.57 (L) 3.87 - 5.11 MIL/uL   Hemoglobin 11.2 (L) 12.0 - 15.0 g/dL   HCT 91.432.3 (L) 78.236.0 - 95.646.0 %   MCV 90.5 78.0 - 100.0 fL   MCH 31.4 26.0 - 34.0 pg   MCHC 34.7 30.0 - 36.0 g/dL   RDW 21.314.0 08.611.5 - 57.815.5 %   Platelets  285 150 - 400 K/uL    A:  HD#2  [redacted]w[redacted]d with gestational hypertension. Blood pressures now in mild range 24 HR urine 247 Repeat CMP normal  P: 1. MFM consultation placed today, depending on recommendations, patient may be discharged home today with modified bedrest at home.  2. Twice weekly antepartum fetal testing in office with q 3-4 week growth scans  3. Final [place] report from yesterday pending, but per patient EFW 50%.  4. Patient will monitor BPs at home, and preeclampsia precautions given  5. Delivery at term 37 weeks.   Hayley [name] [name]

## 2014-08-02 NOTE — Discharge Summary (Signed)
Obstetric Discharge Summary Reason for Admission: Hypertension 3rd trimester Prenatal Procedures: NST and ultrasound Intrapartum Procedures: n/a Postpartum Procedures: n/a Complications-Operative and Postpartum: n/a HEMOGLOBIN  Date Value Ref Range Status  08/02/2014 11.2* 12.0 - 15.0 g/dL Final  40/98/119110/26/2015 47.811.8 g/dL Final   HCT  Date Value Ref Range Status  08/02/2014 32.3* 36.0 - 46.0 % Final  03/08/2014 34 % Final    Physical Exam:  General: alert, cooperative, appears stated age and no distress ABD: Soft NT Gravid DTRs 2+ B/L no clonus   Discharge Diagnoses: Gesatational hypertension  Discharge Information: Date: 08/02/2014 Activity: modified bedrest Diet: routine Medications: PNV and Procardia 30 mg XL Condition: stable Instructions: Discussed with patient Discharge to: home   Newborn Data: Patient still pregnant   Essie HartINN, Zacaria Pousson [name] 08/02/2014, 2:10 PM

## 2014-08-09 ENCOUNTER — Other Ambulatory Visit (HOSPITAL_COMMUNITY): Payer: Self-pay | Admitting: Obstetrics and Gynecology

## 2014-08-09 ENCOUNTER — Encounter (HOSPITAL_COMMUNITY): Payer: Self-pay | Admitting: Obstetrics and Gynecology

## 2014-08-09 DIAGNOSIS — Z3A33 33 weeks gestation of pregnancy: Secondary | ICD-10-CM

## 2014-08-09 DIAGNOSIS — O133 Gestational [pregnancy-induced] hypertension without significant proteinuria, third trimester: Secondary | ICD-10-CM

## 2014-08-09 DIAGNOSIS — O09523 Supervision of elderly multigravida, third trimester: Secondary | ICD-10-CM

## 2014-08-12 ENCOUNTER — Encounter (HOSPITAL_COMMUNITY): Payer: Self-pay

## 2014-08-12 ENCOUNTER — Ambulatory Visit (HOSPITAL_COMMUNITY)
Admission: RE | Admit: 2014-08-12 | Discharge: 2014-08-12 | Disposition: A | Discharge status: Home / Self Care | Payer: BC Managed Care – PPO | Source: Ambulatory Visit | Attending: Obstetrics and Gynecology | Admitting: Obstetrics and Gynecology

## 2014-08-12 DIAGNOSIS — O133 Gestational [pregnancy-induced] hypertension without significant proteinuria, third trimester: Secondary | ICD-10-CM

## 2014-08-12 DIAGNOSIS — O3421 Maternal care for scar from previous cesarean delivery: Secondary | ICD-10-CM | POA: Diagnosis not present

## 2014-08-12 DIAGNOSIS — Z3A32 32 weeks gestation of pregnancy: Secondary | ICD-10-CM | POA: Insufficient documentation

## 2014-08-12 DIAGNOSIS — Z3A33 33 weeks gestation of pregnancy: Secondary | ICD-10-CM

## 2014-08-12 DIAGNOSIS — O09523 Supervision of elderly multigravida, third trimester: Secondary | ICD-10-CM

## 2014-08-13 ENCOUNTER — Other Ambulatory Visit (HOSPITAL_COMMUNITY): Payer: Self-pay | Admitting: Obstetrics and Gynecology

## 2014-09-08 ENCOUNTER — Encounter (HOSPITAL_COMMUNITY): Payer: Self-pay

## 2014-09-08 ENCOUNTER — Encounter (HOSPITAL_COMMUNITY)
Admission: RE | Admit: 2014-09-08 | Discharge: 2014-09-08 | Disposition: A | Discharge status: Home / Self Care | Payer: BC Managed Care – PPO | Source: Ambulatory Visit | Attending: Obstetrics | Admitting: Obstetrics

## 2014-09-08 HISTORY — DX: Essential (primary) hypertension: I10

## 2014-09-08 LAB — CBC
HCT: 33.1 % — ABNORMAL LOW (ref 36.0–46.0)
Hemoglobin: 11.3 g/dL — ABNORMAL LOW (ref 12.0–15.0)
MCH: 30.4 pg (ref 26.0–34.0)
MCHC: 34.1 g/dL (ref 30.0–36.0)
MCV: 89 fL (ref 78.0–100.0)
PLATELETS: 236 10*3/uL (ref 150–400)
RBC: 3.72 MIL/uL — ABNORMAL LOW (ref 3.87–5.11)
RDW: 14.3 % (ref 11.5–15.5)
WBC: 13.1 10*3/uL — AB (ref 4.0–10.5)

## 2014-09-08 LAB — COMPREHENSIVE METABOLIC PANEL
ALBUMIN: 3.3 g/dL — AB (ref 3.5–5.2)
ALT: 16 U/L (ref 0–35)
AST: 19 U/L (ref 0–37)
Alkaline Phosphatase: 100 U/L (ref 39–117)
Anion gap: 10 (ref 5–15)
BUN: 6 mg/dL (ref 6–23)
CHLORIDE: 105 mmol/L (ref 96–112)
CO2: 21 mmol/L (ref 19–32)
CREATININE: 0.45 mg/dL — AB (ref 0.50–1.10)
Calcium: 9.1 mg/dL (ref 8.4–10.5)
GFR calc Af Amer: >90 mL/min (ref >90)
GFR calc non Af Amer: >90 mL/min (ref >90)
Glucose, Bld: 98 mg/dL (ref 70–99)
Potassium: 3.6 mmol/L (ref 3.5–5.1)
Sodium: 136 mmol/L (ref 135–145)
TOTAL PROTEIN: 6.8 g/dL (ref 6.0–8.3)
Total Bilirubin: 0.2 mg/dL — ABNORMAL LOW (ref 0.3–1.2)

## 2014-09-08 LAB — TYPE AND SCREEN
ABO/RH(D): O POS
Antibody Screen: NEGATIVE

## 2014-09-08 NOTE — Patient Instructions (Signed)
Your procedure is scheduled on:09/10/14  Enter through the Main Entrance at :7am Pick up desk phone and dial [phone] and inform us of your arrival.  Please call [phone] if you have any problems the morning of surgery.  Remember: Do not eat food after midnight:Thursday Water ok until: 7am on Friday   You may brush your teeth the morning of surgery.  DO NOT wear jewelry, eye make-up, lipstick,body lotion, or dark fingernail polish.  (Polished toes are ok) You may wear deodorant.  If you are to be admitted after surgery, leave suitcase in car until your room has been assigned. Patients discharged on the day of surgery will not be allowed to drive home. Wear loose fitting, comfortable clothes for your ride home.

## 2014-09-08 NOTE — Pre-Procedure Instructions (Signed)
Pt stated Dr. Chestine Sporelark told her to hold BP med on DOS- Dr.K.Jackson aware of this.

## 2014-09-08 NOTE — Pre-Procedure Instructions (Signed)
Chasity in OR notified of pt being [redacted] weeks pregnant- has PIH.

## 2014-09-09 LAB — RPR: RPR Ser Ql: NONREACTIVE

## 2014-09-10 ENCOUNTER — Encounter (HOSPITAL_COMMUNITY)
Admission: RE | Disposition: A | Discharge status: Home / Self Care | Payer: Self-pay | Source: Ambulatory Visit | Attending: Obstetrics

## 2014-09-10 ENCOUNTER — Inpatient Hospital Stay (HOSPITAL_COMMUNITY): Payer: BC Managed Care – PPO | Admitting: Anesthesiology

## 2014-09-10 ENCOUNTER — Inpatient Hospital Stay (HOSPITAL_COMMUNITY)
Admission: RE | Admit: 2014-09-10 | Discharge: 2014-09-12 | DRG: 765 | Disposition: A | Discharge status: Home / Self Care | Payer: BC Managed Care – PPO | Source: Ambulatory Visit | Attending: Obstetrics | Admitting: Obstetrics

## 2014-09-10 ENCOUNTER — Encounter (HOSPITAL_COMMUNITY): Payer: Self-pay | Admitting: Anesthesiology

## 2014-09-10 DIAGNOSIS — O09523 Supervision of elderly multigravida, third trimester: Secondary | ICD-10-CM | POA: Diagnosis not present

## 2014-09-10 DIAGNOSIS — O99214 Obesity complicating childbirth: Secondary | ICD-10-CM | POA: Diagnosis present

## 2014-09-10 DIAGNOSIS — Z87891 Personal history of nicotine dependence: Secondary | ICD-10-CM

## 2014-09-10 DIAGNOSIS — Z302 Encounter for sterilization: Secondary | ICD-10-CM

## 2014-09-10 DIAGNOSIS — Z98891 History of uterine scar from previous surgery: Secondary | ICD-10-CM

## 2014-09-10 DIAGNOSIS — O3421 Maternal care for scar from previous cesarean delivery: Principal | ICD-10-CM | POA: Diagnosis present

## 2014-09-10 DIAGNOSIS — Z3A37 37 weeks gestation of pregnancy: Secondary | ICD-10-CM | POA: Diagnosis present

## 2014-09-10 DIAGNOSIS — E669 Obesity, unspecified: Secondary | ICD-10-CM | POA: Diagnosis present

## 2014-09-10 DIAGNOSIS — Z6831 Body mass index (BMI) 31.0-31.9, adult: Secondary | ICD-10-CM | POA: Diagnosis not present

## 2014-09-10 DIAGNOSIS — O133 Gestational [pregnancy-induced] hypertension without significant proteinuria, third trimester: Secondary | ICD-10-CM | POA: Diagnosis present

## 2014-09-10 SURGERY — Surgical Case
Anesthesia: Spinal

## 2014-09-10 MED ORDER — NALBUPHINE HCL 10 MG/ML IJ SOLN
5.0000 mg | INTRAMUSCULAR | Status: DC | PRN
  Administered 2014-09-10: 5 mg via SUBCUTANEOUS

## 2014-09-10 MED ORDER — OXYTOCIN 10 UNIT/ML IJ SOLN
INTRAMUSCULAR | Status: AC
  Filled 2014-09-10: qty 4

## 2014-09-10 MED ORDER — ONDANSETRON HCL 4 MG/2ML IJ SOLN
INTRAMUSCULAR | Status: DC | PRN
  Administered 2014-09-10: 4 mg via INTRAVENOUS

## 2014-09-10 MED ORDER — LACTATED RINGERS IV SOLN
INTRAVENOUS | Status: DC | PRN
  Administered 2014-09-10: 12:00:00 via INTRAVENOUS

## 2014-09-10 MED ORDER — SCOPOLAMINE 1 MG/3DAYS TD PT72
MEDICATED_PATCH | TRANSDERMAL | Status: AC
  Administered 2014-09-10: 1.5 mg via TRANSDERMAL
  Filled 2014-09-10: qty 1

## 2014-09-10 MED ORDER — SENNOSIDES-DOCUSATE SODIUM 8.6-50 MG PO TABS
2.0000 | ORAL_TABLET | ORAL | Status: DC
  Administered 2014-09-11: 2 via ORAL
  Filled 2014-09-10 (×2): qty 2

## 2014-09-10 MED ORDER — NALBUPHINE HCL 10 MG/ML IJ SOLN: 5.0000 mg | Freq: Once | INTRAMUSCULAR | Status: AC | PRN

## 2014-09-10 MED ORDER — CEFAZOLIN SODIUM-DEXTROSE 2-3 GM-% IV SOLR
INTRAVENOUS | Status: AC
  Filled 2014-09-10: qty 50

## 2014-09-10 MED ORDER — FENTANYL CITRATE (PF) 100 MCG/2ML IJ SOLN
25.0000 ug | INTRAMUSCULAR | Status: DC | PRN
  Administered 2014-09-10: 25 ug via INTRAVENOUS
  Administered 2014-09-10: 50 ug via INTRAVENOUS

## 2014-09-10 MED ORDER — NALOXONE HCL 1 MG/ML IJ SOLN
1.0000 ug/kg/h | INTRAVENOUS | Status: DC | PRN
  Filled 2014-09-10: qty 2

## 2014-09-10 MED ORDER — SIMETHICONE 80 MG PO CHEW
80.0000 mg | CHEWABLE_TABLET | ORAL | Status: DC | PRN
  Administered 2014-09-11 – 2014-09-12 (×2): 80 mg via ORAL
  Filled 2014-09-10: qty 1

## 2014-09-10 MED ORDER — SIMETHICONE 80 MG PO CHEW
80.0000 mg | CHEWABLE_TABLET | ORAL | Status: DC
  Administered 2014-09-11: 80 mg via ORAL
  Filled 2014-09-10 (×2): qty 1

## 2014-09-10 MED ORDER — LACTATED RINGERS IV SOLN
INTRAVENOUS | Status: DC
  Administered 2014-09-10: 21:00:00 via INTRAVENOUS

## 2014-09-10 MED ORDER — SIMETHICONE 80 MG PO CHEW
80.0000 mg | CHEWABLE_TABLET | Freq: Three times a day (TID) | ORAL | Status: DC
  Administered 2014-09-10 – 2014-09-12 (×4): 80 mg via ORAL
  Filled 2014-09-10 (×5): qty 1

## 2014-09-10 MED ORDER — PHENYLEPHRINE 8 MG IN D5W 100 ML (0.08MG/ML) PREMIX OPTIME
INJECTION | INTRAVENOUS | Status: DC | PRN
  Administered 2014-09-10: 60 ug/min via INTRAVENOUS

## 2014-09-10 MED ORDER — DIPHENHYDRAMINE HCL 50 MG/ML IJ SOLN
12.5000 mg | INTRAMUSCULAR | Status: DC | PRN
Start: 2014-09-10 — End: 2014-09-12

## 2014-09-10 MED ORDER — BUPIVACAINE IN DEXTROSE 0.75-8.25 % IT SOLN
INTRATHECAL | Status: DC | PRN
  Administered 2014-09-10: 1.4 mL via INTRATHECAL

## 2014-09-10 MED ORDER — OXYTOCIN 40 UNITS IN LACTATED RINGERS INFUSION - SIMPLE MED: 62.5000 mL/h | INTRAVENOUS | Status: AC

## 2014-09-10 MED ORDER — DIPHENHYDRAMINE HCL 25 MG PO CAPS
25.0000 mg | ORAL_CAPSULE | ORAL | Status: DC | PRN
  Administered 2014-09-11: 25 mg via ORAL
  Filled 2014-09-10 (×2): qty 1

## 2014-09-10 MED ORDER — METOCLOPRAMIDE HCL 5 MG/ML IJ SOLN
10.0000 mg | Freq: Once | INTRAMUSCULAR | Status: DC | PRN
Start: 2014-09-10 — End: 2014-09-10

## 2014-09-10 MED ORDER — PHENYLEPHRINE HCL 10 MG/ML IJ SOLN
INTRAMUSCULAR | Status: DC | PRN
  Administered 2014-09-10: 80 ug via INTRAVENOUS

## 2014-09-10 MED ORDER — MEPERIDINE HCL 25 MG/ML IJ SOLN: 6.2500 mg | INTRAMUSCULAR | Status: DC | PRN

## 2014-09-10 MED ORDER — NALBUPHINE HCL 10 MG/ML IJ SOLN: 5.0000 mg | INTRAMUSCULAR | Status: DC | PRN

## 2014-09-10 MED ORDER — TETANUS-DIPHTH-ACELL PERTUSSIS 5-2.5-18.5 LF-MCG/0.5 IM SUSP: 0.5000 mL | Freq: Once | INTRAMUSCULAR | Status: DC

## 2014-09-10 MED ORDER — MISOPROSTOL 200 MCG PO TABS
ORAL_TABLET | ORAL | Status: AC
  Filled 2014-09-10: qty 5

## 2014-09-10 MED ORDER — FENTANYL CITRATE (PF) 100 MCG/2ML IJ SOLN
INTRAMUSCULAR | Status: AC
  Filled 2014-09-10: qty 2

## 2014-09-10 MED ORDER — FENTANYL CITRATE (PF) 100 MCG/2ML IJ SOLN
INTRAMUSCULAR | Status: AC
Start: 2014-09-10 — End: 2014-09-10
  Filled 2014-09-10: qty 2

## 2014-09-10 MED ORDER — FENTANYL CITRATE (PF) 100 MCG/2ML IJ SOLN
INTRAMUSCULAR | Status: AC
  Administered 2014-09-10: 25 ug via INTRAVENOUS
  Filled 2014-09-10: qty 2

## 2014-09-10 MED ORDER — LACTATED RINGERS IV SOLN
INTRAVENOUS | Status: DC
  Administered 2014-09-10 (×2): via INTRAVENOUS

## 2014-09-10 MED ORDER — MISOPROSTOL 200 MCG PO TABS
1000.0000 ug | ORAL_TABLET | Freq: Once | ORAL | Status: AC
  Administered 2014-09-10: 1000 ug via RECTAL
  Filled 2014-09-10: qty 5

## 2014-09-10 MED ORDER — WITCH HAZEL-GLYCERIN EX PADS: 1.0000 "application " | MEDICATED_PAD | CUTANEOUS | Status: DC | PRN

## 2014-09-10 MED ORDER — IBUPROFEN 600 MG PO TABS
600.0000 mg | ORAL_TABLET | Freq: Four times a day (QID) | ORAL | Status: DC
  Administered 2014-09-11 – 2014-09-12 (×5): 600 mg via ORAL
  Filled 2014-09-10 (×5): qty 1

## 2014-09-10 MED ORDER — NALOXONE HCL 0.4 MG/ML IJ SOLN: 0.4000 mg | INTRAMUSCULAR | Status: DC | PRN

## 2014-09-10 MED ORDER — MORPHINE SULFATE 0.5 MG/ML IJ SOLN
INTRAMUSCULAR | Status: AC
  Filled 2014-09-10: qty 10

## 2014-09-10 MED ORDER — LACTATED RINGERS IV SOLN
Freq: Once | INTRAVENOUS | Status: AC
  Administered 2014-09-10: 11:00:00 via INTRAVENOUS

## 2014-09-10 MED ORDER — SCOPOLAMINE 1 MG/3DAYS TD PT72
1.0000 | MEDICATED_PATCH | Freq: Once | TRANSDERMAL | Status: DC
  Administered 2014-09-10: 1.5 mg via TRANSDERMAL

## 2014-09-10 MED ORDER — LANOLIN HYDROUS EX OINT: 1.0000 "application " | TOPICAL_OINTMENT | CUTANEOUS | Status: DC | PRN

## 2014-09-10 MED ORDER — OXYTOCIN 10 UNIT/ML IJ SOLN
40.0000 [IU] | INTRAVENOUS | Status: DC | PRN
  Administered 2014-09-10: 40 [IU] via INTRAVENOUS

## 2014-09-10 MED ORDER — MENTHOL 3 MG MT LOZG: 1.0000 | LOZENGE | OROMUCOSAL | Status: DC | PRN

## 2014-09-10 MED ORDER — SODIUM CHLORIDE 0.9 % IR SOLN
Status: DC | PRN
  Administered 2014-09-10: 1000 mL

## 2014-09-10 MED ORDER — HYDROCODONE-ACETAMINOPHEN 5-325 MG PO TABS
1.0000 | ORAL_TABLET | Freq: Four times a day (QID) | ORAL | Status: DC | PRN
  Administered 2014-09-11: 1 via ORAL
  Administered 2014-09-11 (×2): 2 via ORAL
  Administered 2014-09-11: 1 via ORAL
  Administered 2014-09-12: 2 via ORAL
  Filled 2014-09-10: qty 1
  Filled 2014-09-10 (×4): qty 2

## 2014-09-10 MED ORDER — PRENATAL MULTIVITAMIN CH
1.0000 | ORAL_TABLET | Freq: Every day | ORAL | Status: DC
  Administered 2014-09-11: 1 via ORAL
  Filled 2014-09-10: qty 1

## 2014-09-10 MED ORDER — PHENYLEPHRINE 8 MG IN D5W 100 ML (0.08MG/ML) PREMIX OPTIME
INJECTION | INTRAVENOUS | Status: AC
  Filled 2014-09-10: qty 100

## 2014-09-10 MED ORDER — KETOROLAC TROMETHAMINE 30 MG/ML IJ SOLN
30.0000 mg | Freq: Four times a day (QID) | INTRAMUSCULAR | Status: AC | PRN
  Administered 2014-09-10: 30 mg via INTRAVENOUS
  Filled 2014-09-10: qty 1

## 2014-09-10 MED ORDER — KETOROLAC TROMETHAMINE 30 MG/ML IJ SOLN
30.0000 mg | Freq: Four times a day (QID) | INTRAMUSCULAR | Status: AC | PRN
  Administered 2014-09-10: 30 mg via INTRAMUSCULAR

## 2014-09-10 MED ORDER — NALBUPHINE HCL 10 MG/ML IJ SOLN
INTRAMUSCULAR | Status: AC
  Filled 2014-09-10: qty 1

## 2014-09-10 MED ORDER — LACTATED RINGERS IV SOLN: INTRAVENOUS | Status: DC

## 2014-09-10 MED ORDER — KETOROLAC TROMETHAMINE 30 MG/ML IJ SOLN
INTRAMUSCULAR | Status: AC
  Filled 2014-09-10: qty 1

## 2014-09-10 MED ORDER — FENTANYL CITRATE (PF) 100 MCG/2ML IJ SOLN
INTRAMUSCULAR | Status: DC | PRN
  Administered 2014-09-10: 50 ug via INTRAVENOUS
  Administered 2014-09-10: 25 ug via INTRATHECAL
  Administered 2014-09-10: 50 ug via INTRAVENOUS

## 2014-09-10 MED ORDER — IBUPROFEN 600 MG PO TABS: 600.0000 mg | ORAL_TABLET | Freq: Four times a day (QID) | ORAL | Status: DC | PRN

## 2014-09-10 MED ORDER — SODIUM CHLORIDE 0.9 % IJ SOLN: 3.0000 mL | INTRAMUSCULAR | Status: DC | PRN

## 2014-09-10 MED ORDER — DIBUCAINE 1 % RE OINT: 1.0000 "application " | TOPICAL_OINTMENT | RECTAL | Status: DC | PRN

## 2014-09-10 MED ORDER — DIPHENHYDRAMINE HCL 25 MG PO CAPS: 25.0000 mg | ORAL_CAPSULE | Freq: Four times a day (QID) | ORAL | Status: DC | PRN

## 2014-09-10 MED ORDER — ONDANSETRON HCL 4 MG/2ML IJ SOLN: 4.0000 mg | Freq: Three times a day (TID) | INTRAMUSCULAR | Status: DC | PRN

## 2014-09-10 MED ORDER — ONDANSETRON HCL 4 MG/2ML IJ SOLN
INTRAMUSCULAR | Status: AC
Start: 2014-09-10 — End: 2014-09-10
  Filled 2014-09-10: qty 2

## 2014-09-10 MED ORDER — MORPHINE SULFATE (PF) 0.5 MG/ML IJ SOLN
INTRAMUSCULAR | Status: DC | PRN
  Administered 2014-09-10: .15 mg via INTRATHECAL

## 2014-09-10 MED ORDER — ACETAMINOPHEN 325 MG PO TABS
650.0000 mg | ORAL_TABLET | ORAL | Status: DC | PRN
  Administered 2014-09-10: 650 mg via ORAL
  Filled 2014-09-10: qty 2

## 2014-09-10 MED ORDER — CEFAZOLIN SODIUM-DEXTROSE 2-3 GM-% IV SOLR
2.0000 g | INTRAVENOUS | Status: AC
  Administered 2014-09-10: 2 g via INTRAVENOUS

## 2014-09-10 SURGICAL SUPPLY — 36 items
APL SKNCLS STERI-STRIP NONHPOA (GAUZE/BANDAGES/DRESSINGS) ×1
BENZOIN TINCTURE PRP APPL 2/3 (GAUZE/BANDAGES/DRESSINGS) ×3 IMPLANT
CLAMP CORD UMBIL (MISCELLANEOUS) IMPLANT
CLOSURE WOUND 1/2 X4 (GAUZE/BANDAGES/DRESSINGS) ×1
CLOTH BEACON ORANGE TIMEOUT ST (SAFETY) ×3 IMPLANT
DRAPE SHEET LG 3/4 BI-LAMINATE (DRAPES) ×3 IMPLANT
DRSG OPSITE POSTOP 4X10 (GAUZE/BANDAGES/DRESSINGS) ×3 IMPLANT
DRSG TELFA 3X8 NADH (GAUZE/BANDAGES/DRESSINGS) ×3 IMPLANT
DURAPREP 26ML APPLICATOR (WOUND CARE) ×3 IMPLANT
ELECT REM PT RETURN 9FT ADLT (ELECTROSURGICAL) ×3
ELECTRODE REM PT RTRN 9FT ADLT (ELECTROSURGICAL) ×1 IMPLANT
GLOVE BIO SURGEON STRL SZ 6 (GLOVE) ×3 IMPLANT
GLOVE INDICATOR 6.5 STRL GRN (GLOVE) ×3 IMPLANT
GOWN STRL REUS W/TWL LRG LVL3 (GOWN DISPOSABLE) ×6 IMPLANT
HEMOSTAT SURGICEL 4X8 (HEMOSTASIS) ×3 IMPLANT
NS IRRIG 1000ML POUR BTL (IV SOLUTION) ×3 IMPLANT
PACK C SECTION WH (CUSTOM PROCEDURE TRAY) ×3 IMPLANT
PAD ABD 7.5X8 STRL (GAUZE/BANDAGES/DRESSINGS) ×3 IMPLANT
PAD OB MATERNITY 4.3X12.25 (PERSONAL CARE ITEMS) ×3 IMPLANT
RTRCTR C-SECT PINK 25CM LRG (MISCELLANEOUS) ×3 IMPLANT
SPONGE GAUZE 4X4 12PLY STER LF (GAUZE/BANDAGES/DRESSINGS) ×6 IMPLANT
STRIP CLOSURE SKIN 1/2X4 (GAUZE/BANDAGES/DRESSINGS) ×2 IMPLANT
SUT MNCRL 0 VIOLET CTX 36 (SUTURE) ×2 IMPLANT
SUT MNCRL AB 3-0 PS2 27 (SUTURE) ×3 IMPLANT
SUT MONOCRYL 0 CTX 36 (SUTURE) ×4
SUT PLAIN 0 NONE (SUTURE) ×3 IMPLANT
SUT PLAIN 2 0 (SUTURE) ×2
SUT PLAIN ABS 2-0 CT1 27XMFL (SUTURE) ×1 IMPLANT
SUT VIC AB 0 CTX 36 (SUTURE) ×4
SUT VIC AB 0 CTX36XBRD ANBCTRL (SUTURE) ×2 IMPLANT
SUT VIC AB 2-0 CT1 27 (SUTURE) ×3
SUT VIC AB 2-0 CT1 TAPERPNT 27 (SUTURE) ×1 IMPLANT
SUT VIC AB 4-0 KS 27 (SUTURE) ×3 IMPLANT
TAPE CLOTH SURG 4X10 WHT LF (GAUZE/BANDAGES/DRESSINGS) ×3 IMPLANT
TOWEL OR 17X24 6PK STRL BLUE (TOWEL DISPOSABLE) ×3 IMPLANT
TRAY FOLEY CATH SILVER 14FR (SET/KITS/TRAYS/PACK) ×3 IMPLANT

## 2014-09-10 NOTE — Anesthesia Procedure Notes (Signed)
Spinal Patient location during procedure: OR Start time: 09/10/2014 11:15 AM Staffing Anesthesiologist: Mal AmabileFOSTER, Shifra Swartzentruber Performed by: anesthesiologist  Preanesthetic Checklist Completed: patient identified, site marked, surgical consent, pre-op evaluation, timeout performed, IV checked, risks and benefits discussed and monitors and equipment checked Spinal Block Patient position: sitting Prep: site prepped and draped and DuraPrep Patient monitoring: heart rate, cardiac monitor, continuous pulse ox and blood pressure Approach: midline Location: L4-5 Injection technique: single-shot Needle Needle type: Sprotte  Needle gauge: 24 G Needle length: 9 cm Needle insertion depth: 5.5 cm Assessment Sensory level: T4 Additional Notes Patient tolerted procedure well. Adequate sensory level.

## 2014-09-10 NOTE — Op Note (Signed)
Cesarean Section Procedure Note  Pre-operative Diagnosis: 1. Intrauterine pregnancy at [redacted]w[redacted]d  2. Gestational Hypertension 3.  Prior cesarean section 4. Desires permanent sterilization  Post-operative Diagnosis: same as above  Surgeon: Marlow Baars, MD  Assistants: Philip Aspen, DO  Procedure: Repeat low transverse cesarean section with parkland bilateral tubal ligation  Anesthesia: Spinal anesthesia  Estimated Blood Loss: 1000 mL         Drains: Foley catheter         Specimens: portion of bilateral fallopian tubes and placenta to pathology         Implants: none         Complications:  None; patient tolerated the procedure well.         Disposition: PACU - hemodynamically stable.  Findings:  Normal uterus, tubes and ovaries bilaterally.  Viable female infant, 5g (5lb 10oz) Apgars 9, 9.    Procedure Details   The patient was counseled about the risks, benefits, complications of the cesarean section as well as the permanent nature of a tubal ligation.  She elected to proceed with a tubal ligation. Consent was obtained.    After spinal anesthesia was found to adequate , the patient was placed in the dorsal supine position with a leftward tilt, draped and prepped in the usual sterile manner. A Pfannenstiel incision was made at the level of the prior incision and carried down through the subcutaneous tissue to the fascia.  The fascia was incised in the midline and the fascial incision was extended laterally with Mayo scissors. The superior aspect of the fascial incision was grasped with two Kocher clamp, tented up and the rectus muscles dissected off bluntly. The rectus was then dissected off with blunt dissection and Mayo scissors inferiorly. The rectus muscles were separated in the midline with a hemostat.  An area of peritoneum clear of bowel and bladder was identified, grasped with a hemostat, and entered sharply with Metzenbaum scissors. The was extended superiorly and  inferiorly with good visualization of the bladder. The vesicouterine peritoneum was identified, tented up, entered bluntly.  The bladder was adhered to the mid-portion of the uterus.  This was taken down sharply with Metzenbaum scissors. The Alexis retractor was deployed.  The lower uterine segment was re-examined and noted to be free of bladder. Scalpel was then used to make a low transverse incision on the uterus which was extended laterally with blunt dissection. The fluid was clear. The fetal vertex was identified, brought to the hysterotomy, and baby delivered in an atraumatic fashion.  A body cord was noted.  The cord was clamped and cut and the infant was passed to the waiting neonatologist.  Placenta was then delivered spontaneously, intact and appear normal, the uterus was cleared of all clot and debris.   The hysterotomy was repaired with #0 Monocryl in running locked fashion.   At this time, attention was turned to the left fallopian tube.  The tube was grasped with two babcock clamps.  An avascular segment of the mesosalpinx was identified, and a parkland tubal ligation was performed in the usual fashion with #0 plain gut suture.  Tubal ostia were identified on both remaining sides of the tube.  This was repeated with the right fallopian tube. The portion of the left and right fallopian tubes were sent to pathology.   The serosal edges of the incision were oozy from the significant adhesions of the bladder to the uterus, and bovie cautery was used to achieve hemostasis.  The hysterotomy was  reexamined and excellent hemostasis was noted. A piece of Surgicel was placed over the hysterotomy and denuded serosa at the upper edge of the hysterotomy. The abdominal cavity was cleared of all clot and debris.  The Alexis retractor was removed from the abdomen. The fascia and rectus muscles were inspected and were hemostatic. The fascia was closed with 0 Vicryl in a running fashion. The subcuticular layer was  irrigated and all bleeders cauterized.  The skin was closed with 3-0 monocryl in a subcuticular fashion. The incision was dressed with benzoine, steri strips and pressure dressing. All sponge lap and needle counts were correct x3. Patient tolerated the procedure well.  Following closure, she was noted to have significant clot expressed from the uterus upon fundal massage.  Fundus was firm and vital signs were stable.  I attempted to perform a uterine sweep.  The cervix was two to three centimeters dilated.  I could appreciated a moderate amount of clot in the lower uterine segment, but was unable to evacuate it due to minimal cervical dilation.  I placed of rectal cytotec.  Fundal massage again performed and bleeding had decreased to a slow trickle.  Approximately 200 mL of clot were expressed, bringing the total EBL to 1000 mL as noted above.  The patient was transferred to PACU in stable condition following the procedure.

## 2014-09-10 NOTE — H&P (Signed)
34 y.o. [id number] @ 7238w0d presents for a repeat cesarean section in the setting of gestational hypertension.  She was admitted at 31 weeks with elevated BPs.  She was started on procardia XL, and BPs have been stable since admission. Otherwise has good fetal movement and no bleeding.  Desires permanent sterilization.  Feels well today: no ha/bv/ruq pain.   Past Medical History  Diagnosis Date  . Complication of anesthesia 2008    spinal not working, had general anesthesia  . Hypertension 2007    post partum HTN    Past Surgical History  Procedure Laterality Date  . Cholecystectomy    . Cesarean section      OB History  Gravida Para Term Preterm AB SAB TAB Ectopic Multiple Living  4 1 1  0 2 2 0 0 0 1    # Outcome Date GA Lbr Len/2nd Weight Sex Delivery Anes PTL Lv  4 Current           3 SAB           2 SAB           1 Term               History   Social History  . Marital Status: Married    Spouse Name: N/A  . Number of Children: N/A  . Years of Education: N/A   Occupational History  . Not on file.   Social History Main Topics  . Smoking status: Former Games developermoker  . Smokeless tobacco: Not on file  . Alcohol Use: No  . Drug Use: No  . Sexual Activity: Yes   Other Topics Concern  . Not on file   Social History Narrative   Percocet    Prenatal Transfer Tool  Maternal Diabetes: No Genetic Screening: Normal Maternal Ultrasounds/Referrals: Normal Fetal Ultrasounds or other Referrals:  None Maternal Substance Abuse:  No Significant Maternal Medications:  Meds include: Other:  Procardia XL Significant Maternal Lab Results: Lab values include: Group B Strep negative  ABO, Rh: --/--/O POS (04/27 1015) Antibody: NEG (04/27 1015) Rubella:  Non Immune RPR: Non Reactive (04/27 1015)  HBsAg: Negative (10/26 0000)  HIV: Non-reactive (10/26 0000)  GBS:   negative   Other PNC: AMA.    Filed Vitals:   09/10/14 1014  BP: 148/94  Pulse: 88  Temp: 98.4 F (36.9 C)   Resp: 20     General:  NAD  Abdomen:  soft, gravid FHTs 148 Ex:  Trace edema     A/P   34 y.o.   [id number] at 2438w0d presents for repeat cesarean section and BTL in setting of gestational hypertension.  Reviewed the risks of cesarean section to include infection, bleeding, damage to surrounding structures (including but not limited to bowel, bladder, ovaries, baby, nerves, vessels), need for additional surgical procedures, blood clot, blood transfusion, failure of BTL (resulting in pregnancy, including ectopic pregnancy), risk of regret. Consent signed.  Ancef 2gm on call to OR  [facility] GEFFEL Chestine SporeLARK

## 2014-09-10 NOTE — Anesthesia Postprocedure Evaluation (Signed)
Anesthesia Post Note  Patient: Hayley [name]  Procedure(s) Performed: Procedure(s) (LRB): CESAREAN SECTION WITH BILATERAL TUBAL LIGATION (N/A)  Anesthesia type: Spinal  Patient location: PACU  Post pain: Pain level controlled  Post assessment: Post-op Vital signs reviewed  Last Vitals:  Filed Vitals:   09/10/14 1400  BP: 141/83  Pulse: 74  Temp:   Resp: 16    Post vital signs: Reviewed  Level of consciousness: awake  Complications: No apparent anesthesia complications

## 2014-09-10 NOTE — Lactation Note (Signed)
This note was copied from the chart of Hayley Ronal FearMary Bolger. Lactation Consultation Note Experienced BF mom who BF her 34 yr old daughter for 1 yr w/o difficulty. Stated her nipples were more everted then than now. Mom has short haft nipples that compress inwards. Mom has latched baby to Rt. Nipple but stated it was just on nipple, wasn't deep. Mom fitted w/#16 NS, left #20 for growth. Taught mom application and care. Shells given to mom to wear tomorrow w/bra to assist nipples to evert more.  Mom shown how to use DEBP & how to disassemble, clean, & reassemble parts. Mom knows to post pump q3h for 15-20 min. After BF. Mom encouraged to feed baby 8-12 times/24 hours and with feeding cues. Mom encouraged to waken baby for feeds. Mom encouraged to do skin-to-skin. Educated about newborn behavior. Baby is 37 wks. Gave LPI information sheet d/t baby less than 6 lbs.  Referred to Baby and Me Book in Breastfeeding section Pg. 22-23 for position options and Proper latch demonstration.Encouraged comfort during BF so colostrum flows better and mom will enjoy the feeding longer. Taking deep breaths and breast massage during BF. WH/LC brochure given w/resources, support groups and LC services. Patient Name: Hayley [name] ZOXWR'UToday's Date: 09/10/2014 Reason for consult: Initial assessment;Infant < 6lbs   Maternal Data Has patient been taught Hand Expression?: Yes Does the patient have breastfeeding experience prior to this delivery?: Yes  Feeding Feeding Type: Breast Milk Length of feed: 10 min  LATCH Score/Interventions Latch: Repeated attempts needed to sustain latch, nipple held in mouth throughout feeding, stimulation needed to elicit sucking reflex. Intervention(s): Adjust position;Assist with latch;Breast massage;Breast compression  Audible Swallowing: A few with stimulation Intervention(s): Skin to skin;Hand expression;Alternate breast massage  Type of Nipple: Everted at rest and after stimulation (  compresses inwards to short shaft )  Comfort (Breast/Nipple): Soft / non-tender     Hold (Positioning): Assistance needed to correctly position infant at breast and maintain latch. Intervention(s): Breastfeeding basics reviewed;Support Pillows;Position options;Skin to skin  LATCH Score: 7  Lactation Tools Discussed/Used Tools: Shells;Nipple Dorris CarnesShields;Pump Nipple shield size: 16 Shell Type: Inverted Breast pump type: Double-Electric Breast Pump Pump Review: Setup, frequency, and cleaning;Milk Storage Initiated by:: Peri JeffersonL. Elizebeth Kluesner RN Date initiated:: 09/10/14   Consult Status Consult Status: Follow-up Date: 09/11/14 Follow-up type: In-patient    Charyl DancerCARVER, Dyshon Philbin G 09/10/2014, 10:44 PM

## 2014-09-10 NOTE — Brief Op Note (Signed)
09/10/2014  12:52 PM  PATIENT:  Celedonio MiyamotoMary R Wickstrom  34 y.o. female  PRE-OPERATIVE DIAGNOSIS:  REPEAT CESAREAN SECTION WITH BILATERAL TUBAL LIGATION  POST-OPERATIVE DIAGNOSIS:  REPEAT CESAREAN SECTION WITH BILATERAL TUBAL LIGATION  PROCEDURE:  Procedure(s) with comments: CESAREAN SECTION WITH BILATERAL TUBAL LIGATION (N/A) - NKDA  EDD 10/01/14  SURGEON:  Surgeon(s) and Role:    * Marlow Baarsyanna Alethea Terhaar, MD - Primary    * Philip AspenSidney Callahan, DO - Assisting  ANESTHESIA:   spinal  EBL:  Total I/O In: 2400 [I.V.:2400] Out: 1400 [Urine:400; Blood:1000]  BLOOD ADMINISTERED:none  DRAINS: none   LOCAL MEDICATIONS USED:  NONE  SPECIMEN:  Source of Specimen:  portion of bilateral fallopian tube  DISPOSITION OF SPECIMEN:  PATHOLOGY  COUNTS:  YES  TOURNIQUET:  * No tourniquets in log *  DICTATION: .Note written in EPIC  PLAN OF CARE: Admit to inpatient   PATIENT DISPOSITION:  PACU - hemodynamically stable.   Delay start of Pharmacological VTE agent (>24hrs) due to surgical blood loss or risk of bleeding: n/a

## 2014-09-10 NOTE — Transfer of Care (Signed)
Immediate Anesthesia Transfer of Care Note  Patient: Hayley MiyamotoMary R Santone  Procedure(s) Performed: Procedure(s) with comments: CESAREAN SECTION WITH BILATERAL TUBAL LIGATION (N/A) - NKDA  EDD 10/01/14  Patient Location: PACU  Anesthesia Type:Spinal  Level of Consciousness: awake, alert  and oriented  Airway & Oxygen Therapy: Patient Spontanous Breathing  Post-op Assessment: Report given to RN and Post -op Vital signs reviewed and stable  Post vital signs: Reviewed and stable  Last Vitals:  Filed Vitals:   09/10/14 1014  BP: 148/94  Pulse: 88  Temp: 36.9 C  Resp: 20    Complications: No apparent anesthesia complications

## 2014-09-10 NOTE — Anesthesia Preprocedure Evaluation (Signed)
Anesthesia Evaluation  Patient identified by MRN, date of birth, ID band Patient awake    Reviewed: Allergy & Precautions, Patient's Chart, lab work & pertinent test results  History of Anesthesia Complications (+) history of anesthetic complications  Airway Mallampati: II  TM Distance: >3 FB Neck ROM: Full    Dental no notable dental hx. (+) Teeth Intact   Pulmonary neg pulmonary ROS, former smoker,  breath sounds clear to auscultation  Pulmonary exam normal       Cardiovascular hypertension, Pt. on medications Rhythm:Regular Rate:Normal     Neuro/Psych negative neurological ROS  negative psych ROS   GI/Hepatic negative GI ROS, Neg liver ROS,   Endo/Other  Obesity  Renal/GU negative Renal ROS  negative genitourinary   Musculoskeletal negative musculoskeletal ROS (+)   Abdominal (+) + obese,   Peds  Hematology  (+) anemia ,   Anesthesia Other Findings   Reproductive/Obstetrics (+) Pregnancy Previous C/Section Desires sterilization                             Anesthesia Physical Anesthesia Plan  ASA: II  Anesthesia Plan: Spinal   Post-op Pain Management:    Induction:   Airway Management Planned: Natural Airway  Additional Equipment:   Intra-op Plan:   Post-operative Plan:   Informed Consent: I have reviewed the patients History and Physical, chart, labs and discussed the procedure including the risks, benefits and alternatives for the proposed anesthesia with the patient or authorized representative who has indicated his/her understanding and acceptance.     Plan Discussed with: CRNA, Anesthesiologist and Surgeon  Anesthesia Plan Comments:         Anesthesia Quick Evaluation

## 2014-09-11 LAB — CBC
HEMATOCRIT: 27 % — AB (ref 36.0–46.0)
Hemoglobin: 9.3 g/dL — ABNORMAL LOW (ref 12.0–15.0)
MCH: 30.2 pg (ref 26.0–34.0)
MCHC: 34.4 g/dL (ref 30.0–36.0)
MCV: 87.7 fL (ref 78.0–100.0)
Platelets: 224 10*3/uL (ref 150–400)
RBC: 3.08 MIL/uL — ABNORMAL LOW (ref 3.87–5.11)
RDW: 14.1 % (ref 11.5–15.5)
WBC: 13.6 10*3/uL — AB (ref 4.0–10.5)

## 2014-09-11 MED ORDER — MEASLES, MUMPS & RUBELLA VAC ~~LOC~~ INJ
0.5000 mL | INJECTION | Freq: Once | SUBCUTANEOUS | Status: AC
  Administered 2014-09-12: 0.5 mL via SUBCUTANEOUS
  Filled 2014-09-11: qty 0.5

## 2014-09-11 NOTE — Lactation Note (Signed)
This note was copied from the chart of Hayley [name]. Lactation Consultation Note F/u check on BF. Mom states BF going well w/o problems. States she see colostrum in NS. Denies painful latches. Has only used DEBP once today. Encouraged to use after BF to stimulate milk supply d/t small baby and 37 weeks. Stressed importance of BF and I&O d/t low weight.  Patient Name: Hayley [name] JYNWG'NToday's Date: 09/11/2014 Reason for consult: Follow-up assessment;Infant < 6lbs   Maternal Data    Feeding    LATCH Score/Interventions          Comfort (Breast/Nipple): Soft / non-tender           Lactation Tools Discussed/Used     Consult Status Consult Status: Follow-up Date: 09/12/14 Follow-up type: In-patient    Charyl DancerCARVER, Violeta Lecount G 09/11/2014, 2:45 PM

## 2014-09-11 NOTE — Addendum Note (Signed)
Addendum  created 09/11/14 0804 by Jhonnie GarnerBeth M Hollie Bartus, CRNA   Modules edited: Notes Section   Notes Section:  File: [id number]

## 2014-09-11 NOTE — Progress Notes (Signed)
Patient is doing well.  She is tolerating PO, ambulating, voiding.  Pain is controlled.  Lochia is appropriate  Filed Vitals:   09/10/14 2200 09/11/14 0200 09/11/14 0538 09/11/14 0600  BP:  121/76 129/80 139/74  Pulse:  71 84 84  Temp: 98.5 F (36.9 C) 98.3 F (36.8 C) 98.2 F (36.8 C) 98.2 F (36.8 C)  TempSrc: Oral Oral Oral Oral  Resp: 18 18 18 18   Height:      Weight:      SpO2: 97% 98% 98% 96%    NAD  Abdomen:  soft, appropriate tenderness, incisions intact.  No erythema ext:    Symmetric, 1+  edema bilaterally  Lab Results  Component Value Date   WBC 13.6* 09/11/2014   HGB 9.3* 09/11/2014   HCT 27.0* 09/11/2014   MCV 87.7 09/11/2014   PLT 224 09/11/2014    --/--/O POS (04/27 1015)/REquivocal  A/P    33 y.o. [id number] POD 1 s/p RCS and BTL at 37 wks 2/2 GHTN BPs mild range.  Will cont to hold procardia XL at this time.  Restart only if persistently severe.  Routine post op and postpartum care.   Plan d/c tomorrow

## 2014-09-11 NOTE — Anesthesia Postprocedure Evaluation (Signed)
Anesthesia Post Note  Patient: Hayley MiyamotoMary R [name]  Procedure(s) Performed: Procedure(s) (LRB): CESAREAN SECTION WITH BILATERAL TUBAL LIGATION (N/A)  Anesthesia type: SAB  Patient location: Mother/Baby  Post pain: Pain level controlled  Post assessment: Post-op Vital signs reviewed  Last Vitals:  Filed Vitals:   09/11/14 0600  BP: 139/74  Pulse: 84  Temp: 36.8 C  Resp: 18    Post vital signs: Reviewed  Level of consciousness: awake  Complications: No apparent anesthesia complications

## 2014-09-12 ENCOUNTER — Ambulatory Visit: Payer: Self-pay

## 2014-09-12 MED ORDER — IBUPROFEN 600 MG PO TABS: 600.0000 mg | ORAL_TABLET | Freq: Four times a day (QID) | ORAL | Status: DC | PRN

## 2014-09-12 MED ORDER — HYDROCODONE-ACETAMINOPHEN 5-325 MG PO TABS: 1.0000 | ORAL_TABLET | Freq: Four times a day (QID) | ORAL | Status: DC | PRN

## 2014-09-12 NOTE — Progress Notes (Signed)
Pt c/o slight tenderness in right lower leg when assessing for DVT. Dr Chestine Sporelark notified. No new orders given. Pt educated on signs and symptoms of DVT and when to notify MD. Will continue to monitor pt. Tonna BoehringerL. Brook Haidee Stogsdill RN.

## 2014-09-12 NOTE — Discharge Instructions (Signed)

## 2014-09-12 NOTE — Progress Notes (Signed)
Patient is doing well.  She is tolerating PO, ambulating, voiding.  Pain is controlled.  Lochia is appropriate  Filed Vitals:   09/11/14 0600 09/11/14 1045 09/11/14 1831 09/12/14 0552  BP: 139/74 134/74 145/87 138/80  Pulse: 84 79 81 84  Temp: 98.2 F (36.8 C) 98.4 F (36.9 C) 98.3 F (36.8 C) 98 F (36.7 C)  TempSrc: Oral Oral Oral Oral  Resp: 18 20 19 19   Height:      Weight:      SpO2: 96% 97%  99%    NAD Abdomen:  soft, appropriate tenderness, incisions intact.  No erythema.  Honeycomb dressing in place, pt to remove today ext:    Symmetric, 1+  edema bilaterally  Lab Results  Component Value Date   WBC 13.6* 09/11/2014   HGB 9.3* 09/11/2014   HCT 27.0* 09/11/2014   MCV 87.7 09/11/2014   PLT 224 09/11/2014    --/--/O POS (04/27 1015)/REquivocal  A/P    34 y.o. [id number] POD#2  s/p RCS and BTL at 37 wks 2/2 GHTN BPs mild range.  Will cont to hold procardia XL at this time.  Meeting all goals.  D/C today.  MMR prior to d/c F/u 1 week for incision/BP check.

## 2014-09-12 NOTE — Discharge Summary (Signed)
Obstetric Discharge Summary Reason for Admission: cesarean section Prenatal Procedures: NST/US Intrapartum Procedures: cesarean: low cervical, transverse and tubal ligation Postpartum Procedures: MMR Complications-Operative and Postpartum: none HEMOGLOBIN  Date Value Ref Range Status  09/11/2014 9.3* 12.0 - 15.0 g/dL Final  03/08/2014 11.8 g/dL Final   HCT  Date Value Ref Range Status  09/11/2014 27.0* 36.0 - 46.0 % Final  03/08/2014 34 % Final    Physical Exam:  General: alert, cooperative and appears stated age 34: appropriate Uterine Fundus: firm Incision: healing well DVT Evaluation: No evidence of DVT seen on physical exam.  Discharge Diagnoses: Term Pregnancy-delivered and and gestational hypertension  Discharge Information: Date: 09/12/2014 Activity: pelvic rest Diet: routine Medications: PNV, Ibuprofen, Colace and Percocet Condition: stable Instructions: refer to practice specific booklet Discharge to: home Follow-up Information    Follow up with [facility] Lars Masson, MD On 09/20/2014.   Specialty:  Obstetrics   Why:  For wound re-check   Contact information:   [place] [address] [phone]       Newborn Data: Live born female  Birth Weight: 5 lb 10.3 oz (2560 g) APGAR: 9, 10  Home with mother.  [place] 09/12/2014, 9:10 AM

## 2014-09-12 NOTE — Lactation Note (Signed)
This note was copied from the chart of Hayley Ronal FearMary Sesma. Lactation Consultation Note: Mother has small scab on the left nipple. Mother states that the left nipple is painful. Mother states that infant latches well with wide open mouth on her right breast.  Mother was sat up with a DEBP and has pumped her breast several time. Infant is 247 hours old and is at 8 % weight loss this am. Mother states that she prefers not to supplement with formula. Mother was taught hand expression and observed good flow of colostrum . Mother states she has a breastfeeding spoon and plans to spoon feed infant. Mother states that private LC visited her yesterday and states that her LC will follow up with her in her home today. Mother was given comfort gels and a handpump. Reviewed treatment to prevent severe engorgement. Mother has an old Medela pump that she used for one year and she also loaned out for an extented time. Mother advised to have pressures checked on her old pump. She was informed of available rental as needed. Advised to feed infant 8-12 times in 24 hours and to supplement with ebm after each feeding. Mother has a Peds appt in the am for weight check.   Patient Name: Hayley [name] JWJXB'JToday's Date: 09/12/2014 Reason for consult: Follow-up assessment   Maternal Data    Feeding Length of feed: 20 min  LATCH Score/Interventions                      Lactation Tools Discussed/Used     Consult Status Consult Status: Complete    Michel BickersKendrick, Kameryn Tisdel McCoy 09/12/2014, 11:44 AM

## 2014-09-13 ENCOUNTER — Encounter (HOSPITAL_COMMUNITY): Payer: Self-pay | Admitting: Obstetrics

## 2015-04-29 LAB — HM PAP SMEAR

## 2017-03-19 ENCOUNTER — Emergency Department (HOSPITAL_COMMUNITY)
Admission: EM | Admit: 2017-03-19 | Discharge: 2017-03-19 | Disposition: A | Discharge status: Home / Self Care | Payer: BC Managed Care – PPO | Attending: Emergency Medicine | Admitting: Emergency Medicine

## 2017-03-19 ENCOUNTER — Encounter (HOSPITAL_COMMUNITY): Payer: Self-pay

## 2017-03-19 DIAGNOSIS — Z79899 Other long term (current) drug therapy: Secondary | ICD-10-CM | POA: Insufficient documentation

## 2017-03-19 DIAGNOSIS — Z87891 Personal history of nicotine dependence: Secondary | ICD-10-CM | POA: Diagnosis not present

## 2017-03-19 DIAGNOSIS — I10 Essential (primary) hypertension: Secondary | ICD-10-CM | POA: Insufficient documentation

## 2017-03-19 LAB — CBC
HEMATOCRIT: 40.2 % (ref 36.0–46.0)
Hemoglobin: 14 g/dL (ref 12.0–15.0)
MCH: 32 pg (ref 26.0–34.0)
MCHC: 34.8 g/dL (ref 30.0–36.0)
MCV: 91.8 fL (ref 78.0–100.0)
Platelets: 314 10*3/uL (ref 150–400)
RBC: 4.38 MIL/uL (ref 3.87–5.11)
RDW: 13.2 % (ref 11.5–15.5)
WBC: 10.9 10*3/uL — AB (ref 4.0–10.5)

## 2017-03-19 LAB — BASIC METABOLIC PANEL
ANION GAP: 11 (ref 5–15)
BUN: 9 mg/dL (ref 6–20)
CALCIUM: 9.5 mg/dL (ref 8.9–10.3)
CO2: 22 mmol/L (ref 22–32)
Chloride: 103 mmol/L (ref 101–111)
Creatinine, Ser: 0.66 mg/dL (ref 0.44–1.00)
GFR calc Af Amer: >60 mL/min (ref >60)
GFR calc non Af Amer: >60 mL/min (ref >60)
GLUCOSE: 134 mg/dL — AB (ref 65–99)
Potassium: 3.7 mmol/L (ref 3.5–5.1)
Sodium: 136 mmol/L (ref 135–145)

## 2017-03-19 LAB — I-STAT TROPONIN, ED: Troponin i, poc: 0 ng/mL (ref 0.00–0.08)

## 2017-03-19 MED ORDER — LISINOPRIL-HYDROCHLOROTHIAZIDE 10-12.5 MG PO TABS: 1.0000 | ORAL_TABLET | Freq: Every day | ORAL | 0 refills | Status: DC

## 2017-03-19 NOTE — ED Provider Notes (Signed)
Planes of gradual onset diffuse headache 4 days ago.  No visual changes no other associated symptoms.  On exam pleasant cooperative no distress.  Not ill appearing HEENT exam no facial asymmetry eyes pupils equal round react light fundi benign.  Neurologic cranial nerves II through XII grossly intact was all extremities well.  Signs of hypertensive emergency or hypertensive encephalopathy   Doug SouJacubowitz, Sir Mallis, MD 03/19/17 2036

## 2017-03-19 NOTE — ED Triage Notes (Signed)
Pt seen at [facility] Monday for sinus and cold symptoms was told her BP was a bit high while she was there, went back to [facility] today for persistent symptoms and was told to go to the hospital to be seen for hypertension.

## 2017-03-19 NOTE — Discharge Instructions (Signed)
Start taking the blood pressure medicine once daily.  There is information in the paperwork about dietary changes to help with high blood pressure. Follow-up with your primary care doctor in 1 week for evaluation of your blood pressure. Return to the emergency room if you develop vision changes, chest pain, shortness of breath, numbness, slurred speech, or any new or worsening symptoms.

## 2017-03-19 NOTE — ED Notes (Signed)
Provider at bedside

## 2017-03-19 NOTE — ED Provider Notes (Signed)
[facility] EMERGENCY DEPARTMENT Provider Note   CSN: [id number] Arrival date & time: 03/19/17  1540     History   Chief Complaint Chief Complaint  Patient presents with  . Hypertension    HPI Hayley [name] is a 36 y.o. female presenting for evaluation of hypertension.  Patient states that she went to her primary care doctor yesterday for several day history of HA, sinus pressure, cough and congestion.  At this time, patient was noted to have high blood pressure.  She was started on prednisone, Magic mouthwash, and Flonase.  Today she was at school when she started to have a severe headache.  She checked her blood pressure, and is found to be elevated.  She went to the doctor who also found her blood pressure to be elevated.  She was told she needed to come to the emergency room for evaluation. Patient denies current headache, vision changes, dizziness, slurred speech, numbness, chest pain, shortness of breath, nausea, vomiting, abdominal pain.  She has a history of gestational hypertension 2 years ago.   Per paper chart from PCP that patient brought with her, initial EKG performed showed sinus tach, and repeat EKG showed possible changes. Pt given clonidine 0.1 x2, at PCP which improved BP.   HPI  Past Medical History:  Diagnosis Date  . Complication of anesthesia 2008   spinal not working, had general anesthesia  . Hypertension 2007   post partum HTN    Patient Active Problem List   Diagnosis Date Noted  . Status post repeat low transverse cesarean section 09/10/2014  . Gestational hypertension w/o significant proteinuria in 3rd trimester   . [redacted] weeks gestation of pregnancy   . Encounter for fetal anatomic survey   . Hypertension affecting pregnancy, antepartum 07/31/2014    Past Surgical History:  Procedure Laterality Date  . CESAREAN SECTION    . CHOLECYSTECTOMY      OB History    Gravida Para Term Preterm AB Living   4 2 2  0 2 1   SAB TAB  Ectopic Multiple Live Births   2 0 0 0 1       Home Medications    Prior to Admission medications   Medication Sig Start Date End Date Taking? Authorizing Provider  ibuprofen (ADVIL,MOTRIN) 200 MG tablet Take 400 mg every 6 (six) hours as needed by mouth for headache.   Yes [provider]  magic mouthwash w/lidocaine SOLN Take 5 mLs 4 (four) times daily as needed by mouth for mouth pain.   Yes [provider]  Multiple Vitamins-Minerals (RA ONE DAILY GUMMY VITES PO) Take 2 tablets daily by mouth.   Yes [provider]  predniSONE (DELTASONE) 20 MG tablet Take 40 mg daily with breakfast by mouth. 03/19/17 03/23/17 Yes [provider]  HYDROcodone-acetaminophen (NORCO/VICODIN) 5-325 MG per tablet Take 1-2 tablets by mouth every 6 (six) hours as needed for severe pain. Patient not taking: Reported on 03/19/2017 09/12/14   Marlow Baars, MD  ibuprofen (ADVIL,MOTRIN) 600 MG tablet Take 1 tablet (600 mg total) by mouth every 6 (six) hours as needed for mild pain. Patient not taking: Reported on 03/19/2017 09/12/14   Marlow Baars, MD  lisinopril-hydrochlorothiazide (ZESTORETIC) 10-12.5 MG tablet Take 1 tablet daily by mouth. 03/19/17   Anabeth Chilcott, PA-C    Family History Family History  Problem Relation Age of Onset  . Hearing loss Mother   . Hypertension Mother   . Alcohol abuse Brother   .  Cancer Paternal Aunt   . Arthritis Maternal Grandmother   . Cancer Maternal Grandmother   . Heart disease Maternal Grandmother   . Hypertension Maternal Grandmother   . Stroke Maternal Grandmother   . Cancer Father     Social History Social History   Tobacco Use  . Smoking status: Former Games developermoker  . Smokeless tobacco: Never Used  Substance Use Topics  . Alcohol use: No  . Drug use: No     Allergies   Percocet [oxycodone-acetaminophen] and Prednisone   Review of Systems Review of Systems  Neurological: Positive for headaches (Resolved).  All other  systems reviewed and are negative.    Physical Exam Updated Vital Signs BP (!) 163/117   Pulse 84   Temp 98.4 F (36.9 C) (Oral)   Resp 18   Ht 5' (1.524 m)   Wt 68.5 kg (151 lb)   SpO2 99%   BMI 29.49 kg/m   Physical Exam  Constitutional: She is oriented to person, place, and time. She appears well-developed and well-nourished. No distress.  HENT:  Head: Normocephalic and atraumatic.  Mouth/Throat: Uvula is midline, oropharynx is clear and moist and mucous membranes are normal.  Eyes: Conjunctivae and EOM are normal. Pupils are equal, round, and reactive to light.  Neck: Normal range of motion.  Cardiovascular: Normal rate, regular rhythm and intact distal pulses.  Pulmonary/Chest: Effort normal and breath sounds normal. No respiratory distress. She has no wheezes. She has no rales. She exhibits no tenderness.  Abdominal: Soft. She exhibits no distension and no mass. There is no tenderness. There is no guarding.  Musculoskeletal: Normal range of motion.  Neurological: She is alert and oriented to person, place, and time. She has normal strength. No cranial nerve deficit or sensory deficit. She displays a negative Romberg sign. GCS eye subscore is 4. GCS verbal subscore is 5. GCS motor subscore is 6.  Skin: Skin is warm and dry.  Psychiatric: She has a normal mood and affect.  Nursing note and vitals reviewed.    ED Treatments / Results  Labs (all labs ordered are listed, but only abnormal results are displayed) Labs Reviewed  CBC - Abnormal; Notable for the following components:      Result Value   WBC 10.9 (*)    All other components within normal limits  BASIC METABOLIC PANEL - Abnormal; Notable for the following components:   Glucose, Bld 134 (*)    All other components within normal limits  I-STAT TROPONIN, ED    EKG  EKG Interpretation  Date/Time:  Tuesday March 19 2017 15:48:57 EST Ventricular Rate:  119 PR Interval:  128 QRS Duration: 82 QT  Interval:  342 QTC Calculation: 481 R Axis:   73 Text Interpretation:  Sinus tachycardia Possible Left atrial enlargement Borderline ECG No old tracing to compare Confirmed by Montclair State UniversityJacubowitz, Doreatha MartinSam [phone]([id number]) on 03/19/2017 8:31:06 PM       Radiology No results found.  Procedures Procedures (including critical care time)  Medications Ordered in ED Medications - No data to display   Initial Impression / Assessment and Plan / ED Course  I have reviewed the triage vital signs and the nursing notes.  Pertinent labs & imaging results that were available during my care of the patient were reviewed by me and considered in my medical decision making (see chart for details).     Presenting for evaluation of her blood pressure.  She is currently asymptomatic.  Physical exam reassuring, no sign of neurologic deficits,  no facial asymmetry, no sign of hypertensive emergency or encephalopathy.  Patient initially tachycardic, which improved with her blood pressure throughout her visit.  Will order basic lab work to check kidney function and cardiac function.  EKG in the ER reassuring.  Lab work reassuring, no sign of kidney damage.  Discussed findings with patient.  She remains asymptomatic.  Will discharge patient with blood pressure medication and have her follow-up with PCP in 1 week for further evaluation.  Case discussed with attending, Dr. Ethelda ChickJacubowitz evaluated the patient and agrees to plan.  At this time, patient appears safe for discharge.  Strict return precautions given.  Patient states she understands and agrees to plan.   Final Clinical Impressions(s) / ED Diagnoses   Final diagnoses:  Hypertension, unspecified type    ED Discharge Orders        Ordered    lisinopril-hydrochlorothiazide (ZESTORETIC) 10-12.5 MG tablet  Daily     03/19/17 2040       Alveria ApleyCaccavale, Caroline Longie, PA-C 03/20/17 0157    Doug SouJacubowitz, Sam, MD 03/24/17 1251

## 2017-03-20 ENCOUNTER — Telehealth: Payer: Self-pay | Admitting: Internal Medicine

## 2017-03-20 NOTE — Telephone Encounter (Signed)
Patient has sent a request to become a patient of Dr.Crawfords. Do you accept?

## 2017-03-25 NOTE — Telephone Encounter (Signed)
LVM to inform patient Dr.Crawford would accept her as a patient, to call back and set up a New patient appointment.

## 2017-03-25 NOTE — Telephone Encounter (Signed)
Fine

## 2017-03-27 NOTE — Telephone Encounter (Signed)
Patient has set up a new patient appointment for 12/4

## 2017-04-16 ENCOUNTER — Ambulatory Visit: Payer: BC Managed Care – PPO | Admitting: Internal Medicine

## 2017-04-16 ENCOUNTER — Encounter: Payer: Self-pay | Admitting: Internal Medicine

## 2017-04-16 DIAGNOSIS — I10 Essential (primary) hypertension: Secondary | ICD-10-CM | POA: Insufficient documentation

## 2017-04-16 MED ORDER — LISINOPRIL-HYDROCHLOROTHIAZIDE 20-12.5 MG PO TABS: 1.0000 | ORAL_TABLET | Freq: Every day | ORAL | 3 refills | Status: DC

## 2017-04-16 NOTE — Patient Instructions (Signed)
We will have you work on exercising about 3 times per week.  Keep taking the medicine for the blood pressure daily.   You can come get the meter checked at our office if you want.    DASH Eating Plan DASH stands for "Dietary Approaches to Stop Hypertension." The DASH eating plan is a healthy eating plan that has been shown to reduce high blood pressure (hypertension). It may also reduce your risk for type 2 diabetes, heart disease, and stroke. The DASH eating plan may also help with weight loss. What are tips for following this plan? General guidelines  Avoid eating more than 2,300 mg (milligrams) of salt (sodium) a day. If you have hypertension, you may need to reduce your sodium intake to 1,500 mg a day.  Limit alcohol intake to no more than 1 drink a day for nonpregnant women and 2 drinks a day for men. One drink equals 12 oz of beer, 5 oz of wine, or 1 oz of hard liquor.  Work with your health care provider to maintain a healthy body weight or to lose weight. Ask what an ideal weight is for you.  Get at least 30 minutes of exercise that causes your heart to beat faster (aerobic exercise) most days of the week. Activities may include walking, swimming, or biking.  Work with your health care provider or diet and nutrition specialist (dietitian) to adjust your eating plan to your individual calorie needs. Reading food labels  Check food labels for the amount of sodium per serving. Choose foods with less than 5 percent of the Daily Value of sodium. Generally, foods with less than 300 mg of sodium per serving fit into this eating plan.  To find whole grains, look for the word "whole" as the first word in the ingredient list. Shopping  Buy products labeled as "low-sodium" or "no salt added."  Buy fresh foods. Avoid canned foods and premade or frozen meals. Cooking  Avoid adding salt when cooking. Use salt-free seasonings or herbs instead of table salt or sea salt. Check with your  health care provider or pharmacist before using salt substitutes.  Do not fry foods. Cook foods using healthy methods such as baking, boiling, grilling, and broiling instead.  Cook with heart-healthy oils, such as olive, canola, soybean, or sunflower oil. Meal planning   Eat a balanced diet that includes: ? 5 or more servings of fruits and vegetables each day. At each meal, try to fill half of your plate with fruits and vegetables. ? Up to 6-8 servings of whole grains each day. ? Less than 6 oz of lean meat, poultry, or fish each day. A 3-oz serving of meat is about the same size as a deck of cards. One egg equals 1 oz. ? 2 servings of low-fat dairy each day. ? A serving of nuts, seeds, or beans 5 times each week. ? Heart-healthy fats. Healthy fats called Omega-3 fatty acids are found in foods such as flaxseeds and coldwater fish, like sardines, salmon, and mackerel.  Limit how much you eat of the following: ? Canned or prepackaged foods. ? Food that is high in trans fat, such as fried foods. ? Food that is high in saturated fat, such as fatty meat. ? Sweets, desserts, sugary drinks, and other foods with added sugar. ? Full-fat dairy products.  Do not salt foods before eating.  Try to eat at least 2 vegetarian meals each week.  Eat more home-cooked food and less restaurant, buffet, and fast  food.  When eating at a restaurant, ask that your food be prepared with less salt or no salt, if possible. What foods are recommended? The items listed may not be a complete list. Talk with your dietitian about what dietary choices are best for you. Grains Whole-grain or whole-wheat bread. Whole-grain or whole-wheat pasta. Brown rice. [place] Morrow. Bulgur. Whole-grain and low-sodium cereals. Pita bread. Low-fat, low-sodium crackers. Whole-wheat flour tortillas. Vegetables Fresh or frozen vegetables (raw, steamed, roasted, or grilled). Low-sodium or reduced-sodium tomato and vegetable juice.  Low-sodium or reduced-sodium tomato sauce and tomato paste. Low-sodium or reduced-sodium canned vegetables. Fruits All fresh, dried, or frozen fruit. Canned fruit in natural juice (without added sugar). Meat and other protein foods Skinless chicken or [place]. Ground chicken or [place]. Pork with fat trimmed off. Fish and seafood. Egg whites. Dried beans, peas, or lentils. Unsalted nuts, nut butters, and seeds. Unsalted canned beans. Lean cuts of beef with fat trimmed off. Low-sodium, lean deli meat. Dairy Low-fat (1%) or fat-free (skim) milk. Fat-free, low-fat, or reduced-fat cheeses. Nonfat, low-sodium ricotta or cottage cheese. Low-fat or nonfat yogurt. Low-fat, low-sodium cheese. Fats and oils Soft margarine without trans fats. Vegetable oil. Low-fat, reduced-fat, or light mayonnaise and salad dressings (reduced-sodium). Canola, safflower, olive, soybean, and sunflower oils. Avocado. Seasoning and other foods Herbs. Spices. Seasoning mixes without salt. Unsalted popcorn and pretzels. Fat-free sweets. What foods are not recommended? The items listed may not be a complete list. Talk with your dietitian about what dietary choices are best for you. Grains Baked goods made with fat, such as croissants, muffins, or some breads. Dry pasta or rice meal packs. Vegetables Creamed or fried vegetables. Vegetables in a cheese sauce. Regular canned vegetables (not low-sodium or reduced-sodium). Regular canned tomato sauce and paste (not low-sodium or reduced-sodium). Regular tomato and vegetable juice (not low-sodium or reduced-sodium). Angie Fava. Olives. Fruits Canned fruit in a light or heavy syrup. Fried fruit. Fruit in cream or butter sauce. Meat and other protein foods Fatty cuts of meat. Ribs. Fried meat. Berniece Salines. Sausage. Bologna and other processed lunch meats. Salami. Fatback. Hotdogs. Bratwurst. Salted nuts and seeds. Canned beans with added salt. Canned or smoked fish. Whole eggs or egg yolks. Chicken  or [place] with skin. Dairy Whole or 2% milk, cream, and half-and-half. Whole or full-fat cream cheese. Whole-fat or sweetened yogurt. Full-fat cheese. Nondairy creamers. Whipped toppings. Processed cheese and cheese spreads. Fats and oils Butter. Stick margarine. Lard. Shortening. Ghee. Bacon fat. Tropical oils, such as coconut, palm kernel, or palm oil. Seasoning and other foods Salted popcorn and pretzels. Onion salt, garlic salt, seasoned salt, table salt, and sea salt. Worcestershire sauce. Tartar sauce. Barbecue sauce. Teriyaki sauce. Soy sauce, including reduced-sodium. Steak sauce. Canned and packaged gravies. Fish sauce. Oyster sauce. Cocktail sauce. Horseradish that you find on the shelf. Ketchup. Mustard. Meat flavorings and tenderizers. Bouillon cubes. Hot sauce and Tabasco sauce. Premade or packaged marinades. Premade or packaged taco seasonings. Relishes. Regular salad dressings. Where to find more information:  National Heart, Lung, and [place]: https://wilson-eaton.com/  American Heart Association: www.heart.org Summary  The DASH eating plan is a healthy eating plan that has been shown to reduce high blood pressure (hypertension). It may also reduce your risk for type 2 diabetes, heart disease, and stroke.  With the DASH eating plan, you should limit salt (sodium) intake to 2,300 mg a day. If you have hypertension, you may need to reduce your sodium intake to 1,500 mg a day.  When on the DASH  eating plan, aim to eat more fresh fruits and vegetables, whole grains, lean proteins, low-fat dairy, and heart-healthy fats.  Work with your health care provider or diet and nutrition specialist (dietitian) to adjust your eating plan to your individual calorie needs. This information is not intended to replace advice given to you by your health care provider. Make sure you discuss any questions you have with your health care provider. Document Released: 04/19/2011 Document Revised:  04/23/2016 Document Reviewed: 04/23/2016 Elsevier Interactive Patient Education  2017 Elsevier Inc.   [facility] Tendinitis [facility] Ask your health care provider which exercises are safe for you. Do exercises exactly as told by your health care provider and adjust them as directed. It is normal to feel mild stretching, pulling, tightness, or discomfort as you do these exercises, but you should stop right away if you feel sudden pain or your pain gets worse. Do not begin these exercises until told by your health care provider. Stretching and range of motion exercises These exercises warm up your muscles and joints and improve the movement and flexibility of your ankle. These exercises also help to relieve pain, numbness, and tingling. Exercise A: Standing wall calf stretch, knee straight  1. Stand with your hands against a wall. 2. Extend your __________ leg behind you and bend your front knee slightly. Keep both of your heels on the floor. 3. Point the toes of your back foot slightly inward. 4. Keeping your heels on the floor and your back knee straight, shift your weight toward the wall. Do not allow your back to arch. You should feel a gentle stretch in your calf. 5. Hold this position for seconds. Repeat __________ times. Complete this stretch __________ times per day. Exercise B: Standing wall calf stretch, knee bent 1. Stand with your hands against a wall. 2. Extend your __________ leg behind you, and bend your front knee slightly. Keep both of your heels on the floor. 3. Point the toes of your back foot slightly inward. 4. Keeping your heels on the floor, unlock your back knee so that it is bent. You should feel a gentle stretch deep in your calf. 5. Hold this position for __________ seconds. Repeat __________ times. Complete this stretch __________ times per day. Strengthening exercises These exercises build strength and control of your ankle. Endurance is the ability to use your muscles  for a long time, even after they get tired. Exercise C: Plantar flexion with band  1. Sit on the floor with your __________ leg extended. You may put a pillow under your calf to give your foot more room to move. 2. Loop a rubber exercise band or tube around the ball of your __________ foot. The ball of your foot is on the walking surface, right under your toes. The band or tube should be slightly tense when your foot is relaxed. If the band or tube slips, you can put on your shoe or put a washcloth between the band and your foot to help it stay in place. 3. Slowly point your toes downward, pushing them away from you. 4. Hold this position for __________ seconds. 5. Slowly release the tension in the band or tube, controlling smoothly until your foot is back to the starting position. Repeat __________ times. Complete this exercise __________ times per day. Exercise D: Heel raise with eccentric lower  1. Stand on a step with the balls of your feet. The ball of your foot is on the walking surface, right under your toes. ?  Do not put your heels on the step. ? For balance, rest your hands on the wall or on a railing. 2. Rise up onto the balls of your feet. 3. Keeping your heels up, shift all of your weight to your __________ leg and pick up your other leg. 4. Slowly lower your __________ leg so your heel drops below the level of the step. 5. Put down your foot. If told by your health care provider, build up to:  3 sets of 15 repetitions while keeping your knees straight.  3 sets of 15 repetitions while keeping your knees bent as far as told by your health care provider.  Complete this exercise __________ times per day. If this exercise is too easy, try doing it while wearing a backpack with weights in it. Balance exercises These exercises improve or maintain your balance. Balance is important in preventing falls. Exercise E: Single leg stand 1. Without shoes, stand near a railing or in a door  frame. Hold on to the railing or door frame as needed. 2. Stand on your __________ foot. Keep your big toe down on the floor and try to keep your arch lifted. 3. Hold this position for __________ seconds. Repeat __________ times. Complete this exercise __________ times per day. If this exercise is too easy, you can try it with your eyes closed or while standing on a pillow. This information is not intended to replace advice given to you by your health care provider. Make sure you discuss any questions you have with your health care provider. Document Released: 11/29/2004 Document Revised: 01/05/2016 Document Reviewed: 01/04/2015 Elsevier Interactive Patient Education  [employer]2018 Elsevier Inc.

## 2017-04-16 NOTE — Progress Notes (Signed)
   Subjective:    Patient ID: Hayley [name], female    DOB: 05/11/1981, 36 y.o.   MRN: [id number]  HPI The patient is a new 36 YO female coming in for concerns about blood pressure. She had hypertension during her second pregnancy and was on blood pressure for several months afterwards. She was then off and no follow up. Felt well until she had a cold recently. Given prednisone and had high blood pressure reading at that time which was not treated. The prednisone made her feel terrible and sought care at the ER with BP to 200s/120 and they started her on lisinopril/hctz. She saw an urgent care for follow up about 1-2 weeks later and they increased the dose of the lisinopril/hctz. She denies headaches, chest pains, SOB. Eats mostly at home and denies using excessive salt or pre-prepared foods. She does not exercise.  [facility], social history reviewed and updated.   Review of Systems  Constitutional: Negative.   HENT: Negative.   Eyes: Negative.   Respiratory: Negative for cough, chest tightness and shortness of breath.   Cardiovascular: Negative for chest pain, palpitations and leg swelling.  Gastrointestinal: Negative for abdominal distention, abdominal pain, constipation, diarrhea, nausea and vomiting.  Musculoskeletal: Negative.   Skin: Negative.   Neurological: Negative.   Psychiatric/Behavioral: Negative.       Objective:   Physical Exam  Constitutional: She is oriented to person, place, and time. She appears well-developed and well-nourished.  HENT:  Head: Normocephalic and atraumatic.  Eyes: EOM are normal.  Neck: Normal range of motion.  Cardiovascular: Normal rate and regular rhythm.  Pulmonary/Chest: Effort normal and breath sounds normal. No respiratory distress. She has no wheezes. She has no rales.  Abdominal: Soft. Bowel sounds are normal. She exhibits no distension. There is no tenderness. There is no rebound.  Musculoskeletal: She exhibits no edema.  Neurological: She is  alert and oriented to person, place, and time. Coordination normal.  Skin: Skin is warm and dry.  Psychiatric: She has a normal mood and affect.   Vitals:   04/16/17 1503  BP: 120/86  Pulse: 78  Temp: 98.3 F (36.8 C)  TempSrc: Oral  SpO2: 99%  Weight: 151 lb (68.5 kg)  Height: 5' (1.524 m)      Assessment & Plan:

## 2017-04-17 NOTE — Assessment & Plan Note (Signed)
Continue lisinopril/hctz 20/12.5 mg daily for BP. Recent labs without indication for recheck today. Given information on DASH diet and exercise 3 times daily. Follow up in 6 months.

## 2017-05-09 ENCOUNTER — Ambulatory Visit: Payer: BC Managed Care – PPO | Admitting: Family Medicine

## 2017-05-09 ENCOUNTER — Ambulatory Visit: Payer: Self-pay | Admitting: *Deleted

## 2017-05-09 ENCOUNTER — Telehealth: Payer: Self-pay | Admitting: Internal Medicine

## 2017-05-09 MED ORDER — LISINOPRIL-HYDROCHLOROTHIAZIDE 10-12.5 MG PO TABS: 1.0000 | ORAL_TABLET | Freq: Every day | ORAL | 3 refills | Status: DC

## 2017-05-09 NOTE — Telephone Encounter (Addendum)
Pt reports moderate dizziness since starting B/P med 04/16/17... Prinzide/Zestoretic 20-12.5. Reports "lightheadedness" worse with positional changes. States B/P this AM 129/100.  Pt checked BP on home monitor during call, 122/110, HR 90. Pt states "Don't know how accurate the monitor is, it fluctuates." No other symptoms other than mild nausea with dizziness at times. Requesting medication change.  Advised needs to be seen today, prefers appt tomorrow. Appt secured with Ria ClockLaura Murray at 1040 05/10/17. Call routed to practice; Pt states "may be able to make late appt today." Advised to change positions slowly, keep hydrated, continue to monitor B/P and to call back is symptoms worsen.    Addendum: Spoke with [facility] Coordinator [place].Marland Kitchen.appointment made for this afternoon 05/09/17 with Dr. Jordan LikesSchmitz. Reason for Disposition . Taking a medicine that could cause dizziness (e.g., blood pressure medications, diuretics)  Answer Assessment - Initial Assessment Questions 1. DESCRIPTION: "Describe your dizziness."     Lightheaded, ongoing, worse with position changes; sit to stand, bending over 2. LIGHTHEADED: "Do you feel lightheaded?" (e.g., somewhat faint, woozy, weak upon standing)     YEs, "catch my balance"  Mild nausea 3. VERTIGO: "Do you feel like either you or the room is spinning or tilting?" (i.e. vertigo)     Yes 4. SEVERITY: "How bad is it?"  "Do you feel like you are going to faint?" "Can you stand and walk?"   - MILD - walking normally   - MODERATE - interferes with normal activities (e.g., work, school)    - SEVERE - unable to stand, requires support to walk, feels like passing out now.      Moderate 5. ONSET:  "When did the dizziness begin?"     04/16/17  When new B/P med started. 6. AGGRAVATING FACTORS: "Does anything make it worse?" (e.g., standing, change in head position)     Changing position 7. HEART RATE: "Can you tell me your heart rate?" "How many beats in 15 seconds?"  (Note: not all  patients can do this)       90 per monitor, B/P 122/110, sitting 8. CAUSE: "What do you think is causing the dizziness?"     B/P med 9. RECURRENT SYMPTOM: "Have you had dizziness before?" If so, ask: "When was the last time?" "What happened that time?"     No 10. OTHER SYMPTOMS: "Do you have any other symptoms?" (e.g., fever, chest pain, vomiting, diarrhea, bleeding)       no 11. PREGNANCY: "Is there any chance you are pregnant?" "When was your last menstrual period?"       no  Protocols used: DIZZINESS [facility]- LIGHTHEADEDNESS-A-AH

## 2017-05-09 NOTE — Telephone Encounter (Signed)
Routing to dr crawford, please advise, I will call patient back, thanks 

## 2017-05-09 NOTE — Telephone Encounter (Signed)
We have sent in what she was taking before which was lisinopril/hctz 10/12.5 mg daily. Has she been checking blood pressure to see if this is high or low when dizzy? I would not expect this small a change in medicine to cause dizziness.

## 2017-05-09 NOTE — Telephone Encounter (Signed)
Patient states she has been taking her bp several times daily and is getting diastolic readings between 90 and 100 ---she is sometimes dizzy, dizziness just comes and goes but mostly occurs in morning right after she gets up---she would prefer trying additonal medication to help with controlling bottom number of her bp if possible to see if that will help with dizziness before she makes another office visit ---routing to dr crawford, please advise, I will call patient back

## 2017-05-09 NOTE — Telephone Encounter (Signed)
Copied from CRM [phone]. Topic: Quick Communication - See Telephone Encounter >> May 09, 2017  9:01 AM Eston Mouldavis, Eriyana Sweeten B wrote: CRM for notification. See Telephone encounter for: PT says med she was prescribed for high blood pressure  is making her dizzy  and wants to know if she can get something different   05/09/17.lisinopril-hydrochlorothiazide (PRINZIDE,ZESTORETIC) 20-12.5 MG tablet  [facility] [address]

## 2017-05-10 ENCOUNTER — Ambulatory Visit: Payer: BC Managed Care – PPO | Admitting: Family

## 2017-05-16 MED ORDER — AMLODIPINE BESYLATE 10 MG PO TABS: 10.0000 mg | ORAL_TABLET | Freq: Every day | ORAL | 3 refills | Status: DC

## 2017-05-16 NOTE — Telephone Encounter (Signed)
Have sent in amlodipine to add to BP if they are still high. Please call to find out. If still dizzy needs a visit.

## 2017-05-16 NOTE — Addendum Note (Signed)
Addended by: Hillard DankerRAWFORD, Ellena Kamen A on: 05/16/2017 07:56 AM   Modules accepted: Orders

## 2017-05-16 NOTE — Telephone Encounter (Signed)
Patient advised amlodipine sent to [facility]---she would like to try this additional med to see if bp is controlled better and alleviates dizziness---she will call back to schedule appt is dizziness persists

## 2017-05-28 ENCOUNTER — Encounter: Payer: Self-pay | Admitting: Internal Medicine

## 2017-07-09 ENCOUNTER — Telehealth: Payer: Self-pay | Admitting: Internal Medicine

## 2017-07-09 NOTE — Telephone Encounter (Signed)
Copied from CRM [phone]. Topic: Quick Communication - Rx Refill/Question >> Jul 09, 2017  9:39 AM Crist InfanteHarrald, Kathy J wrote: Medication:  tami flu Pt states her daughter just dx yesterday with Type A flu.  Would like a rx for tamil flu if dr will do.  [id number] [place], [place] - [address] [facility] [address] [phone] (Phone) [phone] (Fax)

## 2017-07-10 MED ORDER — OSELTAMIVIR PHOSPHATE 75 MG PO CAPS: 75.0000 mg | ORAL_CAPSULE | Freq: Every day | ORAL | 0 refills | Status: DC

## 2017-07-10 NOTE — Telephone Encounter (Signed)
Need for preventative.. Inform pt MD sent tamiflu to [facility].Marland Kitchen.Raechel Chute/lmb

## 2017-07-10 NOTE — Telephone Encounter (Signed)
Pt's  pharmacy called to report  they received a prescription for Tamiflu called in for patient. Per pharmacist   "preventive" dosage is recommended qd for 10 days and script that was sent over was qd for 5 days.   [facility] [address]

## 2017-07-10 NOTE — Telephone Encounter (Signed)
Robin from [facility] called and said they received a prescription for Tam flu for pt and for a preventive usage dosage should qd for 10 days and  Script that was sent over was qd for 5 days. [facility] [address]

## 2017-07-10 NOTE — Telephone Encounter (Signed)
See pt.'s request. Thanks. 

## 2017-07-10 NOTE — Telephone Encounter (Signed)
Is she having symptoms or for prevention as dosing for tamiflu is different. Have sent in preventative dosing of 1 pill daily. If having symptoms of flu please let me know and we can adjust dosing.

## 2017-07-29 ENCOUNTER — Encounter: Payer: Self-pay | Admitting: Internal Medicine

## 2017-07-29 ENCOUNTER — Ambulatory Visit: Payer: BC Managed Care – PPO | Admitting: Internal Medicine

## 2017-07-29 DIAGNOSIS — J069 Acute upper respiratory infection, unspecified: Secondary | ICD-10-CM

## 2017-07-29 DIAGNOSIS — B9789 Other viral agents as the cause of diseases classified elsewhere: Secondary | ICD-10-CM | POA: Diagnosis not present

## 2017-07-29 MED ORDER — HYDROCODONE-HOMATROPINE 5-1.5 MG/5ML PO SYRP: 5.0000 mL | ORAL_SOLUTION | Freq: Every evening | ORAL | 0 refills | Status: DC | PRN

## 2017-07-29 NOTE — Progress Notes (Signed)
   Subjective:    Patient ID: Hayley [name], female    DOB: 10/08/1980, 37 y.o.   MRN: [id number]  HPI The patient is a 37 YO female coming in for possible cold. She is having nose congestion and sore throat and non-productive cough for about 5 days now. She has been taking otc cold medicines and cough syrup with minimal relief. She is also taking mucinex which is not helping. Overall gradually worsening. She is having 37F temps at home over the weekend. Not in about 2 days or so.  Denies body aches, denies SOB.   Review of Systems  Constitutional: Positive for activity change. Negative for appetite change, chills, fatigue, fever and unexpected weight change.  HENT: Positive for congestion, postnasal drip, rhinorrhea, sinus pressure and sore throat. Negative for ear discharge, ear pain, sinus pain, sneezing, tinnitus, trouble swallowing and voice change.   Eyes: Negative.   Respiratory: Positive for cough. Negative for chest tightness, shortness of breath and wheezing.   Cardiovascular: Negative.   Gastrointestinal: Negative.   Neurological: Positive for headaches.      Objective:   Physical Exam  Constitutional: She is oriented to person, place, and time. She appears well-developed and well-nourished.  HENT:  Head: Normocephalic and atraumatic.  Oropharynx with redness and clear drainage, nose with swollen turbinates, TMs normal bilaterally  Eyes: EOM are normal.  Neck: Normal range of motion. No thyromegaly present.  Cardiovascular: Normal rate and regular rhythm.  Pulmonary/Chest: Effort normal and breath sounds normal. No respiratory distress. She has no wheezes. She has no rales.  Abdominal: Soft.  Musculoskeletal: She exhibits no tenderness.  Lymphadenopathy:    She has no cervical adenopathy.  Neurological: She is alert and oriented to person, place, and time.  Skin: Skin is warm and dry.   Vitals:   07/29/17 1552  BP: 110/82  Pulse: 85  Temp: 98.2 F (36.8 C)  TempSrc:  Oral  SpO2: 99%  Weight: 150 lb (68 kg)  Height: 5' (1.524 m)      Assessment & Plan:

## 2017-07-29 NOTE — Patient Instructions (Addendum)
We will have you take zyrtec (cetirizine) daily to help dry up the drainage.   We have sent in the cough medicine to take at night time as needed.   Call back on Wednesday or Thursday if you are not getting better.

## 2017-07-30 DIAGNOSIS — B9789 Other viral agents as the cause of diseases classified elsewhere: Principal | ICD-10-CM

## 2017-07-30 DIAGNOSIS — J069 Acute upper respiratory infection, unspecified: Secondary | ICD-10-CM | POA: Insufficient documentation

## 2017-07-30 NOTE — Assessment & Plan Note (Signed)
Rx for hycodan cough syrup and advised to start zyrtec daily. [place] narcotic database reviewed and updated.

## 2017-10-24 ENCOUNTER — Ambulatory Visit: Payer: BC Managed Care – PPO | Admitting: Internal Medicine

## 2017-10-31 ENCOUNTER — Ambulatory Visit: Payer: BC Managed Care – PPO | Admitting: Internal Medicine

## 2017-10-31 ENCOUNTER — Encounter: Payer: Self-pay | Admitting: Internal Medicine

## 2017-10-31 DIAGNOSIS — I10 Essential (primary) hypertension: Secondary | ICD-10-CM

## 2017-10-31 MED ORDER — LISINOPRIL-HYDROCHLOROTHIAZIDE 10-12.5 MG PO TABS: 1.0000 | ORAL_TABLET | Freq: Every day | ORAL | 3 refills | Status: DC

## 2017-10-31 MED ORDER — AMLODIPINE BESYLATE 10 MG PO TABS: 10.0000 mg | ORAL_TABLET | Freq: Every day | ORAL | 3 refills | Status: DC

## 2017-10-31 NOTE — Assessment & Plan Note (Addendum)
BP is not at goal and she is not taking medications as prescribed. Refilled amlodipine 10 mg and lisinopril/hctz 10/12.5 mg daily and asked her to resume. She was asked to keep log of BP and call back once she is taking both for several weeks. She is reminded that she can call the pharmacy and ask for refills on her medicines or our office if she runs out in the future. Given DASH eating information.

## 2017-10-31 NOTE — Patient Instructions (Addendum)
We will have you call back with blood pressure readings on both medicines or come back here for a blood pressure check in the next month or so.   We have sent in refills for both medicines. In the future if you run out of medicines call the pharmacy first and then our office if you cannot get your medicines.    DASH Eating Plan DASH stands for "Dietary Approaches to Stop Hypertension." The DASH eating plan is a healthy eating plan that has been shown to reduce high blood pressure (hypertension). It may also reduce your risk for type 2 diabetes, heart disease, and stroke. The DASH eating plan may also help with weight loss. What are tips for following this plan? General guidelines  Avoid eating more than 2,300 mg (milligrams) of salt (sodium) a day. If you have hypertension, you may need to reduce your sodium intake to 1,500 mg a day.  Limit alcohol intake to no more than 1 drink a day for nonpregnant women and 2 drinks a day for men. One drink equals 12 oz of beer, 5 oz of wine, or 1 oz of hard liquor.  Work with your health care provider to maintain a healthy body weight or to lose weight. Ask what an ideal weight is for you.  Get at least 30 minutes of exercise that causes your heart to beat faster (aerobic exercise) most days of the week. Activities may include walking, swimming, or biking.  Work with your health care provider or diet and nutrition specialist (dietitian) to adjust your eating plan to your individual calorie needs. Reading food labels  Check food labels for the amount of sodium per serving. Choose foods with less than 5 percent of the Daily Value of sodium. Generally, foods with less than 300 mg of sodium per serving fit into this eating plan.  To find whole grains, look for the word "whole" as the first word in the ingredient list. Shopping  Buy products labeled as "low-sodium" or "no salt added."  Buy fresh foods. Avoid canned foods and premade or frozen  meals. Cooking  Avoid adding salt when cooking. Use salt-free seasonings or herbs instead of table salt or sea salt. Check with your health care provider or pharmacist before using salt substitutes.  Do not fry foods. Cook foods using healthy methods such as baking, boiling, grilling, and broiling instead.  Cook with heart-healthy oils, such as olive, canola, soybean, or sunflower oil. Meal planning   Eat a balanced diet that includes: ? 5 or more servings of fruits and vegetables each day. At each meal, try to fill half of your plate with fruits and vegetables. ? Up to 6-8 servings of whole grains each day. ? Less than 6 oz of lean meat, poultry, or fish each day. A 3-oz serving of meat is about the same size as a deck of cards. One egg equals 1 oz. ? 2 servings of low-fat dairy each day. ? A serving of nuts, seeds, or beans 5 times each week. ? Heart-healthy fats. Healthy fats called Omega-3 fatty acids are found in foods such as flaxseeds and coldwater fish, like sardines, salmon, and mackerel.  Limit how much you eat of the following: ? Canned or prepackaged foods. ? Food that is high in trans fat, such as fried foods. ? Food that is high in saturated fat, such as fatty meat. ? Sweets, desserts, sugary drinks, and other foods with added sugar. ? Full-fat dairy products.  Do not salt foods  before eating.  Try to eat at least 2 vegetarian meals each week.  Eat more home-cooked food and less restaurant, buffet, and fast food.  When eating at a restaurant, ask that your food be prepared with less salt or no salt, if possible. What foods are recommended? The items listed may not be a complete list. Talk with your dietitian about what dietary choices are best for you. Grains Whole-grain or whole-wheat bread. Whole-grain or whole-wheat pasta. Brown rice. [place] Morrow. Bulgur. Whole-grain and low-sodium cereals. Pita bread. Low-fat, low-sodium crackers. Whole-wheat flour  tortillas. Vegetables Fresh or frozen vegetables (raw, steamed, roasted, or grilled). Low-sodium or reduced-sodium tomato and vegetable juice. Low-sodium or reduced-sodium tomato sauce and tomato paste. Low-sodium or reduced-sodium canned vegetables. Fruits All fresh, dried, or frozen fruit. Canned fruit in natural juice (without added sugar). Meat and other protein foods Skinless chicken or [place]. Ground chicken or [place]. Pork with fat trimmed off. Fish and seafood. Egg whites. Dried beans, peas, or lentils. Unsalted nuts, nut butters, and seeds. Unsalted canned beans. Lean cuts of beef with fat trimmed off. Low-sodium, lean deli meat. Dairy Low-fat (1%) or fat-free (skim) milk. Fat-free, low-fat, or reduced-fat cheeses. Nonfat, low-sodium ricotta or cottage cheese. Low-fat or nonfat yogurt. Low-fat, low-sodium cheese. Fats and oils Soft margarine without trans fats. Vegetable oil. Low-fat, reduced-fat, or light mayonnaise and salad dressings (reduced-sodium). Canola, safflower, olive, soybean, and sunflower oils. Avocado. Seasoning and other foods Herbs. Spices. Seasoning mixes without salt. Unsalted popcorn and pretzels. Fat-free sweets. What foods are not recommended? The items listed may not be a complete list. Talk with your dietitian about what dietary choices are best for you. Grains Baked goods made with fat, such as croissants, muffins, or some breads. Dry pasta or rice meal packs. Vegetables Creamed or fried vegetables. Vegetables in a cheese sauce. Regular canned vegetables (not low-sodium or reduced-sodium). Regular canned tomato sauce and paste (not low-sodium or reduced-sodium). Regular tomato and vegetable juice (not low-sodium or reduced-sodium). Angie Fava. Olives. Fruits Canned fruit in a light or heavy syrup. Fried fruit. Fruit in cream or butter sauce. Meat and other protein foods Fatty cuts of meat. Ribs. Fried meat. Berniece Salines. Sausage. Bologna and other processed lunch meats.  Salami. Fatback. Hotdogs. Bratwurst. Salted nuts and seeds. Canned beans with added salt. Canned or smoked fish. Whole eggs or egg yolks. Chicken or [place] with skin. Dairy Whole or 2% milk, cream, and half-and-half. Whole or full-fat cream cheese. Whole-fat or sweetened yogurt. Full-fat cheese. Nondairy creamers. Whipped toppings. Processed cheese and cheese spreads. Fats and oils Butter. Stick margarine. Lard. Shortening. Ghee. Bacon fat. Tropical oils, such as coconut, palm kernel, or palm oil. Seasoning and other foods Salted popcorn and pretzels. Onion salt, garlic salt, seasoned salt, table salt, and sea salt. Worcestershire sauce. Tartar sauce. Barbecue sauce. Teriyaki sauce. Soy sauce, including reduced-sodium. Steak sauce. Canned and packaged gravies. Fish sauce. Oyster sauce. Cocktail sauce. Horseradish that you find on the shelf. Ketchup. Mustard. Meat flavorings and tenderizers. Bouillon cubes. Hot sauce and Tabasco sauce. Premade or packaged marinades. Premade or packaged taco seasonings. Relishes. Regular salad dressings. Where to find more information:  National Heart, Lung, and [place]: https://wilson-eaton.com/  American Heart Association: www.heart.org Summary  The DASH eating plan is a healthy eating plan that has been shown to reduce high blood pressure (hypertension). It may also reduce your risk for type 2 diabetes, heart disease, and stroke.  With the DASH eating plan, you should limit salt (sodium) intake to 2,300 mg  a day. If you have hypertension, you may need to reduce your sodium intake to 1,500 mg a day.  When on the DASH eating plan, aim to eat more fresh fruits and vegetables, whole grains, lean proteins, low-fat dairy, and heart-healthy fats.  Work with your health care provider or diet and nutrition specialist (dietitian) to adjust your eating plan to your individual calorie needs. This information is not intended to replace advice given to you by your health  care provider. Make sure you discuss any questions you have with your health care provider. Document Released: 04/19/2011 Document Revised: 04/23/2016 Document Reviewed: 04/23/2016 Elsevier Interactive Patient Education  [employer].

## 2017-10-31 NOTE — Progress Notes (Signed)
   Subjective:    Patient ID: Hayley [name], female    DOB: 11/17/1980, 37 y.o.   MRN: [id number]  HPI The patient is a 37 YO female coming in for follow up of blood pressure. After last visit we increased her lisinopril/hctz from 10/12.5 to 20/12.5 mg however this caused her dizziness. She then called back and we went back down to 10/12.5 mg daily and added amlodipine. She continued this until her pharmacy did not give her more. She did feel better while taking both. She is starting to feel slightly bad again. Denies headaches or chest pains. She is now taking lisinopril/hctz 10/12.5 mg daily only.   Review of Systems  Constitutional: Negative.   HENT: Negative.   Eyes: Negative.   Respiratory: Negative for cough, chest tightness and shortness of breath.   Cardiovascular: Negative for chest pain, palpitations and leg swelling.  Gastrointestinal: Negative for abdominal distention, abdominal pain, constipation, diarrhea, nausea and vomiting.  Musculoskeletal: Negative.   Skin: Negative.   Neurological: Negative.   Psychiatric/Behavioral: Negative.       Objective:   Physical Exam  Constitutional: She is oriented to person, place, and time. She appears well-developed and well-nourished.  HENT:  Head: Normocephalic and atraumatic.  Eyes: EOM are normal.  Neck: Normal range of motion.  Cardiovascular: Normal rate and regular rhythm.  Pulmonary/Chest: Effort normal and breath sounds normal. No respiratory distress. She has no wheezes. She has no rales.  Abdominal: Soft. Bowel sounds are normal. She exhibits no distension. There is no tenderness. There is no rebound.  Musculoskeletal: She exhibits no edema.  Neurological: She is alert and oriented to person, place, and time. Coordination normal.  Skin: Skin is warm and dry.  Psychiatric: She has a normal mood and affect.   Vitals:   10/31/17 0846 10/31/17 0914  BP: (!) 130/96 122/90  Pulse: 74   Temp: 97.9 F (36.6 C)   TempSrc: Oral    SpO2: 99%   Weight: 150 lb (68 kg)   Height: 5' (1.524 m)       Assessment & Plan:

## 2018-03-04 ENCOUNTER — Ambulatory Visit: Payer: BC Managed Care – PPO | Admitting: Internal Medicine

## 2018-03-04 ENCOUNTER — Encounter: Payer: Self-pay | Admitting: Internal Medicine

## 2018-03-04 ENCOUNTER — Other Ambulatory Visit (INDEPENDENT_AMBULATORY_CARE_PROVIDER_SITE_OTHER): Payer: BC Managed Care – PPO

## 2018-03-04 VITALS — BP 110/78 | HR 79 | Temp 97.9°F | Ht 60.0 in | Wt 154.0 lb

## 2018-03-04 DIAGNOSIS — Z0000 Encounter for general adult medical examination without abnormal findings: Secondary | ICD-10-CM

## 2018-03-04 DIAGNOSIS — I10 Essential (primary) hypertension: Secondary | ICD-10-CM

## 2018-03-04 DIAGNOSIS — Z23 Encounter for immunization: Secondary | ICD-10-CM | POA: Diagnosis not present

## 2018-03-04 LAB — CBC
HCT: 38.2 % (ref 36.0–46.0)
Hemoglobin: 13.3 g/dL (ref 12.0–15.0)
MCHC: 34.8 g/dL (ref 30.0–36.0)
MCV: 93.7 fl (ref 78.0–100.0)
Platelets: 260 10*3/uL (ref 150.0–400.0)
RBC: 4.08 Mil/uL (ref 3.87–5.11)
RDW: 12.7 % (ref 11.5–15.5)
WBC: 9.6 10*3/uL (ref 4.0–10.5)

## 2018-03-04 LAB — COMPREHENSIVE METABOLIC PANEL
ALBUMIN: 4.7 g/dL (ref 3.5–5.2)
ALT: 26 U/L (ref 0–35)
AST: 21 U/L (ref 0–37)
Alkaline Phosphatase: 48 U/L (ref 39–117)
BUN: 19 mg/dL (ref 6–23)
CO2: 30 mEq/L (ref 19–32)
CREATININE: 0.85 mg/dL (ref 0.40–1.20)
Calcium: 9.7 mg/dL (ref 8.4–10.5)
Chloride: 99 mEq/L (ref 96–112)
GFR: 79.01 mL/min (ref >60.00)
Glucose, Bld: 99 mg/dL (ref 70–99)
Potassium: 3.5 mEq/L (ref 3.5–5.1)
Sodium: 135 mEq/L (ref 135–145)
Total Bilirubin: 0.3 mg/dL (ref 0.2–1.2)
Total Protein: 7.6 g/dL (ref 6.0–8.3)

## 2018-03-04 LAB — LIPID PANEL
CHOLESTEROL: 196 mg/dL (ref 0–200)
HDL: 63.1 mg/dL (ref >39.00)
NonHDL: 133.38
Total CHOL/HDL Ratio: 3
Triglycerides: 261 mg/dL — ABNORMAL HIGH (ref 0.0–149.0)
VLDL: 52.2 mg/dL — ABNORMAL HIGH (ref 0.0–40.0)

## 2018-03-04 LAB — LDL CHOLESTEROL, DIRECT: Direct LDL: 111 mg/dL

## 2018-03-04 NOTE — Progress Notes (Signed)
   Subjective:    Patient ID: Hayley [name], female    DOB: June 04, 1980, 37 y.o.   MRN: [id number]  HPI The patient is a 37 YO female coming in for physical.   PMH, [facility], social history reviewed and updated.   Review of Systems  Constitutional: Negative.   HENT: Negative.   Eyes: Negative.   Respiratory: Negative for cough, chest tightness and shortness of breath.   Cardiovascular: Negative for chest pain, palpitations and leg swelling.  Gastrointestinal: Negative for abdominal distention, abdominal pain, constipation, diarrhea, nausea and vomiting.  Musculoskeletal: Negative.   Skin: Negative.   Neurological: Negative.   Psychiatric/Behavioral: Negative.       Objective:   Physical Exam  Constitutional: She is oriented to person, place, and time. She appears well-developed and well-nourished.  HENT:  Head: Normocephalic and atraumatic.  Eyes: EOM are normal.  Neck: Normal range of motion.  Cardiovascular: Normal rate and regular rhythm.  Pulmonary/Chest: Effort normal and breath sounds normal. No respiratory distress. She has no wheezes. She has no rales.  Abdominal: Soft. Bowel sounds are normal. She exhibits no distension. There is no tenderness. There is no rebound.  Musculoskeletal: She exhibits no edema.  Neurological: She is alert and oriented to person, place, and time. Coordination normal.  Skin: Skin is warm and dry.  Psychiatric: She has a normal mood and affect.   Vitals:   03/04/18 1522  BP: 110/78  Pulse: 79  Temp: 97.9 F (36.6 C)  TempSrc: Oral  SpO2: 99%  Weight: 154 lb (69.9 kg)  Height: 5' (1.524 m)      Assessment & Plan:  Flu shot given at visit

## 2018-03-04 NOTE — Assessment & Plan Note (Signed)
Taking amlodipine 10 mg daily and lisinopril/hctz 10/12.5 mg daily. Checking CMP and adjust as needed.

## 2018-03-04 NOTE — Patient Instructions (Signed)

## 2018-03-04 NOTE — Assessment & Plan Note (Signed)
Pap smear with gyn up to date. Flu shot given at visit. Tetanus up to date. Counseled about sun safety and mole surveillance as well as dangers of distracted driving. Given screening recommendations.

## 2018-10-19 ENCOUNTER — Other Ambulatory Visit: Payer: Self-pay | Admitting: Internal Medicine

## 2019-01-21 ENCOUNTER — Other Ambulatory Visit: Payer: Self-pay

## 2019-01-21 ENCOUNTER — Ambulatory Visit: Payer: Self-pay | Admitting: *Deleted

## 2019-01-21 ENCOUNTER — Encounter: Payer: Self-pay | Admitting: Internal Medicine

## 2019-01-21 ENCOUNTER — Other Ambulatory Visit (INDEPENDENT_AMBULATORY_CARE_PROVIDER_SITE_OTHER): Payer: BC Managed Care – PPO

## 2019-01-21 ENCOUNTER — Ambulatory Visit (INDEPENDENT_AMBULATORY_CARE_PROVIDER_SITE_OTHER): Payer: BC Managed Care – PPO | Admitting: Internal Medicine

## 2019-01-21 VITALS — BP 142/88 | HR 80 | Temp 98.4°F | Ht 60.0 in | Wt 157.0 lb

## 2019-01-21 DIAGNOSIS — L500 Allergic urticaria: Secondary | ICD-10-CM

## 2019-01-21 DIAGNOSIS — I10 Essential (primary) hypertension: Secondary | ICD-10-CM

## 2019-01-21 DIAGNOSIS — T50905A Adverse effect of unspecified drugs, medicaments and biological substances, initial encounter: Secondary | ICD-10-CM

## 2019-01-21 LAB — BASIC METABOLIC PANEL
BUN: 10 mg/dL (ref 6–23)
CO2: 27 mEq/L (ref 19–32)
Calcium: 10.8 mg/dL — ABNORMAL HIGH (ref 8.4–10.5)
Chloride: 98 mEq/L (ref 96–112)
Creatinine, Ser: 0.93 mg/dL (ref 0.40–1.20)
GFR: 66.71 mL/min (ref >60.00)
Glucose, Bld: 97 mg/dL (ref 70–99)
Potassium: 4.1 mEq/L (ref 3.5–5.1)
Sodium: 136 mEq/L (ref 135–145)

## 2019-01-21 LAB — CBC WITH DIFFERENTIAL/PLATELET
Basophils Absolute: 0.1 10*3/uL (ref 0.0–0.1)
Basophils Relative: 1 % (ref 0.0–3.0)
Eosinophils Absolute: 0.2 10*3/uL (ref 0.0–0.7)
Eosinophils Relative: 1.9 % (ref 0.0–5.0)
HCT: 41 % (ref 36.0–46.0)
Hemoglobin: 14.3 g/dL (ref 12.0–15.0)
Lymphocytes Relative: 24.7 % (ref 12.0–46.0)
Lymphs Abs: 2.3 10*3/uL (ref 0.7–4.0)
MCHC: 34.8 g/dL (ref 30.0–36.0)
MCV: 96.2 fl (ref 78.0–100.0)
Monocytes Absolute: 0.8 10*3/uL (ref 0.1–1.0)
Monocytes Relative: 8.6 % (ref 3.0–12.0)
Neutro Abs: 6 10*3/uL (ref 1.4–7.7)
Neutrophils Relative %: 63.8 % (ref 43.0–77.0)
Platelets: 382 10*3/uL (ref 150.0–400.0)
RBC: 4.26 Mil/uL (ref 3.87–5.11)
RDW: 13.7 % (ref 11.5–15.5)
WBC: 9.4 10*3/uL (ref 4.0–10.5)

## 2019-01-21 LAB — HEPATIC FUNCTION PANEL
ALT: 27 U/L (ref 0–35)
AST: 29 U/L (ref 0–37)
Albumin: 4.8 g/dL (ref 3.5–5.2)
Alkaline Phosphatase: 61 U/L (ref 39–117)
Bilirubin, Direct: 0.1 mg/dL (ref 0.0–0.3)
Total Bilirubin: 0.4 mg/dL (ref 0.2–1.2)
Total Protein: 8.3 g/dL (ref 6.0–8.3)

## 2019-01-21 LAB — TSH: TSH: 1.75 u[IU]/mL (ref 0.35–4.50)

## 2019-01-21 LAB — SEDIMENTATION RATE: Sed Rate: 11 mm/hr (ref 0–20)

## 2019-01-21 MED ORDER — DOXEPIN HCL 10 MG PO CAPS: 10.0000 mg | ORAL_CAPSULE | Freq: Every day | ORAL | 0 refills | Status: DC

## 2019-01-21 MED ORDER — MONTELUKAST SODIUM 10 MG PO TABS: 10.0000 mg | ORAL_TABLET | Freq: Every day | ORAL | 0 refills | Status: DC

## 2019-01-21 NOTE — Telephone Encounter (Signed)
Pt called stating that she thinks she is having an allergic reaction; she is having "hives" on her wrists, arms, stomach, back, and legs, and it continues to spread; this have been going on since Friday 01/16/2019; she also had vertigo/ since 01/16/2019 (generalized dizziness); she has not checked her blood pressure because her machine needs batteries; she is most concerned about the hives; the pt has taken Benadryl  (1 tablet twice) because her itching keeps her awake; recommendations made per nurse triage protocol; she verbalized understanding; the pt sees Dr Pricilla Holm, [facility]; pt transferred to [facility] for scheduling.   Reason for Disposition . Hives or itching  Answer Assessment - Initial Assessment Questions 1. APPEARANCE of RASH: "Describe the rash." (e.g., spots, blisters, raised areas, skin peeling, scaly)     red 2. SIZE: "How big are the spots?" (e.g., tip of pen, eraser, coin; inches, centimeters) Pin head to egg sized 3. LOCATION: "Where is the rash located?"   All over body 4. COLOR: "What color is the rash?" (Note: It is difficult to assess rash color in people with darker-colored skin. When this situation occurs, simply ask the caller to describe what they see.)     Red, raised 5. ONSET: "When did the rash begin?"     01/16/2019 6. FEVER: "Do you have a fever?" If so, ask: "What is your temperature, how was it measured, and when did it start?"     no 7. ITCHING: "Does the rash itch?" If so, ask: "How bad is the itch?" (Scale 1-10; or mild, moderate, severe)   5 out of 10 at night, and 2 out of 10 during 8. CAUSE: "What do you think is causing the rash?"    no 9. NEW MEDICATION: "What new medication are you taking?" (e.g., name of antibiotic) "When did you start taking this medication?".    no 10. OTHER SYMPTOMS: "Do you have any other symptoms?" (e.g., sore throat, fever, joint pain)    11. PREGNANCY: "Is there any chance you are pregnant?" "When was your last menstrual  period?"      No, tubal ligation, LMP 01/16/2019  Protocols used: RASH - WIDESPREAD ON DRUGS-A-AH

## 2019-01-21 NOTE — Progress Notes (Signed)
Subjective:  Patient ID: Hayley [name], female    DOB: 03/05/1981  Age: 38 y.o. MRN: [id number]  CC: Hypertension and Rash   HPI Hayley [name] presents for concerns about a 7469-month history of intermittently itchy rash that iha worsened over the last 4 days.  She describes itchy red wheals that come and go from different areas.  She takes Benadryl and gets temporary relief.  She has taken lisinopril and hydrochlorothiazide for many years.  She does not drink alcohol or take anti-inflammatories.  She is not aware of any specific triggers.  Outpatient Medications Prior to Visit  Medication Sig Dispense Refill  . amLODipine (NORVASC) 10 MG tablet TAKE 1 TABLET BY MOUTH EVERY DAY 90 tablet 1  . Multiple Vitamins-Minerals (RA ONE DAILY GUMMY VITES PO) Take 2 tablets daily by mouth.    Marland Kitchen. lisinopril-hydrochlorothiazide (ZESTORETIC) 10-12.5 MG tablet TAKE 1 TABLET BY MOUTH EVERY DAY 90 tablet 1   No facility-administered medications prior to visit.     ROS Review of Systems  Constitutional: Positive for unexpected weight change (wt gain). Negative for chills, diaphoresis, fatigue and fever.  HENT: Negative.  Negative for sore throat and trouble swallowing.   Eyes: Negative.   Respiratory: Negative for cough, chest tightness, shortness of breath and wheezing.   Cardiovascular: Negative for chest pain, palpitations and leg swelling.  Gastrointestinal: Negative for abdominal pain, diarrhea and nausea.  Endocrine: Negative.   Genitourinary: Negative.  Negative for difficulty urinating and hematuria.  Musculoskeletal: Negative.  Negative for arthralgias and myalgias.  Skin: Positive for rash.  Neurological: Negative.   Hematological: Negative for adenopathy. Does not bruise/bleed easily.  Psychiatric/Behavioral: Negative.     Objective:  BP (!) 142/88 (BP Location: Left Arm, Patient Position: Sitting, Cuff Size: Normal)   Pulse 80   Temp 98.4 F (36.9 C) (Oral)   Ht 5' (1.524 m)   Wt 157  lb (71.2 kg)   LMP 01/16/2019   SpO2 96%   BMI 30.66 kg/m   BP Readings from Last 3 Encounters:  01/21/19 (!) 142/88  03/04/18 110/78  10/31/17 122/90    Wt Readings from Last 3 Encounters:  01/21/19 157 lb (71.2 kg)  03/04/18 154 lb (69.9 kg)  10/31/17 150 lb (68 kg)    Physical Exam Vitals signs reviewed.  Constitutional:      General: She is not in acute distress.    Appearance: She is not ill-appearing, toxic-appearing or diaphoretic.  HENT:     Nose: No congestion.     Mouth/Throat:     Mouth: Mucous membranes are moist.  Eyes:     General: No scleral icterus.    Conjunctiva/sclera: Conjunctivae normal.  Neck:     Musculoskeletal: Normal range of motion. No neck rigidity or muscular tenderness.  Cardiovascular:     Rate and Rhythm: Normal rate and regular rhythm.     Heart sounds: No murmur.  Pulmonary:     Effort: Pulmonary effort is normal.     Breath sounds: No stridor. No wheezing, rhonchi or rales.  Abdominal:     General: Abdomen is protuberant. Bowel sounds are normal. There is no distension.     Palpations: There is no hepatomegaly, splenomegaly or mass.     Tenderness: There is no abdominal tenderness.  Musculoskeletal: Normal range of motion.     Right lower leg: No edema.     Left lower leg: No edema.  Lymphadenopathy:     Cervical: No cervical adenopathy.  Skin:  General: Skin is warm.     Findings: Rash present. Rash is urticarial.     Comments: There are blanching urticarial wheals noted around the left axilla and the supra pubic area,  Neurological:     Mental Status: She is alert.     Lab Results  Component Value Date   WBC 9.4 01/21/2019   HGB 14.3 01/21/2019   HCT 41.0 01/21/2019   PLT 382.0 01/21/2019   GLUCOSE 97 01/21/2019   CHOL 196 03/04/2018   TRIG 261.0 (H) 03/04/2018   HDL 63.10 03/04/2018   LDLDIRECT 111.0 03/04/2018   ALT 27 01/21/2019   AST 29 01/21/2019   NA 136 01/21/2019   K 4.1 01/21/2019   CL 98 01/21/2019    CREATININE 0.93 01/21/2019   BUN 10 01/21/2019   CO2 27 01/21/2019   TSH 1.75 01/21/2019    No results found.  Assessment & Plan:   Hayley [name] was seen today for hypertension and rash.  Diagnoses and all orders for this visit:  Essential hypertension- Her BP is not adequately well controlled but she appears to have developed a rash from the ACE inhibitor and hypercalcemia from the thiazide diuretic so I have asked her to stop taking both of these.  If her blood pressure is not adequately well controlled I will recommend that she take an ARB. -     CBC with Differential/Platelet; Future -     Basic metabolic panel; Future -     TSH; Future -     Hepatic function panel; Future  Urticaria due to drug allergy- Her review of systems and labs are reassuring that she does not have a systemic process.  I am concerned this may be caused by lisinopril and possibly hydrochlorothiazide so have asked her to stop taking both of these.  I have listed lisinopril as an allergy.  I recommended that she treat this with a dose of doxepin at bedtime and montelukast once a day. -     doxepin (SINEQUAN) 10 MG capsule; Take 1 capsule (10 mg total) by mouth at bedtime. -     montelukast (SINGULAIR) 10 MG tablet; Take 1 tablet (10 mg total) by mouth at bedtime. -     CBC with Differential/Platelet; Future -     Sedimentation rate; Future -     Hepatic function panel; Future   I have discontinued Hayley [name]'s lisinopril-hydrochlorothiazide. I am also having her start on doxepin and montelukast. Additionally, I am having her maintain her Multiple Vitamins-Minerals (RA ONE DAILY GUMMY VITES PO) and amLODipine.  Meds ordered this encounter  Medications  . doxepin (SINEQUAN) 10 MG capsule    Sig: Take 1 capsule (10 mg total) by mouth at bedtime.    Dispense:  90 capsule    Refill:  0  . montelukast (SINGULAIR) 10 MG tablet    Sig: Take 1 tablet (10 mg total) by mouth at bedtime.    Dispense:  90 tablet     Refill:  0     Follow-up: Return in about 3 weeks (around 02/11/2019).  Scarlette Calico, MD

## 2019-01-21 NOTE — Patient Instructions (Signed)
Angioedema  Angioedema is sudden swelling in the body. The swelling can happen in any part of the body. It often happens on the skin and causes itchy, bumpy patches (hives) to form. This condition may:  Happen only one time.  Happen more than one time. It may come back at random times.  Keep coming back for a number of years. Someday it may stop coming back. Follow these instructions at home:  Take over-the-counter and prescription medicines only as told by your doctor.  If you were given medicines for emergency allergy treatment, always carry them with you.  Wear a medical bracelet as told by your doctor.  Avoid the things that cause your attacks (triggers).  If this condition was passed to you from your parents and you want to have kids, talk to your doctor. Your kids may also have this condition. Contact a doctor if:  You have another attack.  Your attacks happen more often, even after you take steps to prevent them.  This condition was passed to you by your parents and you want to have kids. Get help right away if:  Your mouth, tongue, or lips get very swollen.  You have trouble breathing.  You have trouble swallowing.  You pass out (faint). This information is not intended to replace advice given to you by your health care provider. Make sure you discuss any questions you have with your health care provider. Document Released: 04/18/2009 Document Revised: 04/12/2017 Document Reviewed: 11/08/2015 Elsevier Patient Education  2020 Elsevier Inc.  

## 2019-02-03 ENCOUNTER — Telehealth: Payer: BC Managed Care – PPO | Admitting: Physician Assistant

## 2019-02-03 ENCOUNTER — Ambulatory Visit (INDEPENDENT_AMBULATORY_CARE_PROVIDER_SITE_OTHER): Payer: BC Managed Care – PPO | Admitting: Internal Medicine

## 2019-02-03 ENCOUNTER — Encounter: Payer: Self-pay | Admitting: Internal Medicine

## 2019-02-03 ENCOUNTER — Other Ambulatory Visit: Payer: Self-pay

## 2019-02-03 VITALS — BP 144/100 | HR 108 | Temp 98.2°F | Ht 60.0 in | Wt 156.0 lb

## 2019-02-03 DIAGNOSIS — T50905A Adverse effect of unspecified drugs, medicaments and biological substances, initial encounter: Secondary | ICD-10-CM | POA: Diagnosis not present

## 2019-02-03 DIAGNOSIS — R42 Dizziness and giddiness: Secondary | ICD-10-CM | POA: Diagnosis not present

## 2019-02-03 DIAGNOSIS — L500 Allergic urticaria: Secondary | ICD-10-CM

## 2019-02-03 DIAGNOSIS — R21 Rash and other nonspecific skin eruption: Secondary | ICD-10-CM

## 2019-02-03 DIAGNOSIS — I10 Essential (primary) hypertension: Secondary | ICD-10-CM

## 2019-02-03 MED ORDER — MECLIZINE HCL 25 MG PO TABS: 25.0000 mg | ORAL_TABLET | Freq: Three times a day (TID) | ORAL | 0 refills | Status: DC | PRN

## 2019-02-03 MED ORDER — LISINOPRIL-HYDROCHLOROTHIAZIDE 20-12.5 MG PO TABS: 1.0000 | ORAL_TABLET | Freq: Every day | ORAL | 3 refills | Status: DC

## 2019-02-03 NOTE — Assessment & Plan Note (Signed)
Suspect BPPV, given 1 other episode potentially in the past could be meniere's and we talked about this. Given epley maneuver and meclizine rx to see if this helps symptoms. Does not seem related to the rash.

## 2019-02-03 NOTE — Patient Instructions (Addendum)
We will put you back on the lisinopril/hctz. We will stop the amlodipine to see if this helps the rash.   I would recommend to take benadryl at night time. It is okay to take zyrtec up to 3 times a day to help with the itching.   We have sent in the meclizine to use as needed for dizziness.    Benign Positional Vertigo Vertigo is the feeling that you or your surroundings are moving when they are not. Benign positional vertigo is the most common form of vertigo. This is usually a harmless condition (benign). This condition is positional. This means that symptoms are triggered by certain movements and positions. This condition can be dangerous if it occurs while you are doing something that could cause harm to you or others. This includes activities such as driving or operating machinery. What are the causes? In many cases, the cause of this condition is not known. It may be caused by a disturbance in an area of the inner ear that helps your brain to sense movement and balance. This disturbance can be caused by:  Viral infection (labyrinthitis).  Head injury.  Repetitive motion, such as jumping, dancing, or running. What increases the risk? You are more likely to develop this condition if:  You are a woman.  You are 40 years of age or older. What are the signs or symptoms? Symptoms of this condition usually happen when you move your head or your eyes in different directions. Symptoms may start suddenly, and usually last for less than a minute. They include:  Loss of balance and falling.  Feeling like you are spinning or moving.  Feeling like your surroundings are spinning or moving.  Nausea and vomiting.  Blurred vision.  Dizziness.  Involuntary eye movement (nystagmus). Symptoms can be mild and cause only minor problems, or they can be severe and interfere with daily life. Episodes of benign positional vertigo may return (recur) over time. Symptoms may improve over time. How  is this diagnosed? This condition may be diagnosed based on:  Your medical history.  Physical exam of the head, neck, and ears.  Tests, such as: ? MRI. ? CT scan. ? Eye movement tests. Your health care provider may ask you to change positions quickly while he or she watches you for symptoms of benign positional vertigo, such as nystagmus. Eye movement may be tested with a variety of exams that are designed to evaluate or stimulate vertigo. ? An electroencephalogram (EEG). This records electrical activity in your brain. ? Hearing tests. You may be referred to a health care provider who specializes in ear, nose, and throat (ENT) problems (otolaryngologist) or a provider who specializes in disorders of the nervous system (neurologist). How is this treated?  This condition may be treated in a session in which your health care provider moves your head in specific positions to adjust your inner ear back to normal. Treatment for this condition may take several sessions. Surgery may be needed in severe cases, but this is rare. In some cases, benign positional vertigo may resolve on its own in 2-4 weeks. Follow these instructions at home: Safety  Move slowly. Avoid sudden body or head movements or certain positions, as told by your health care provider.  Avoid driving until your health care provider says it is safe for you to do so.  Avoid operating heavy machinery until your health care provider says it is safe for you to do so.  Avoid doing any tasks that  would be dangerous to you or others if vertigo occurs.  If you have trouble walking or keeping your balance, try using a cane for stability. If you feel dizzy or unstable, sit down right away.  Return to your normal activities as told by your health care provider. Ask your health care provider what activities are safe for you. General instructions  Take over-the-counter and prescription medicines only as told by your health care  provider.  Drink enough fluid to keep your urine pale yellow.  Keep all follow-up visits as told by your health care provider. This is important. Contact a health care provider if:  You have a fever.  Your condition gets worse or you develop new symptoms.  Your family or friends notice any behavioral changes.  You have nausea or vomiting that gets worse.  You have numbness or a "pins and needles" sensation. Get help right away if you:  Have difficulty speaking or moving.  Are always dizzy.  Faint.  Develop severe headaches.  Have weakness in your legs or arms.  Have changes in your hearing or vision.  Develop a stiff neck.  Develop sensitivity to light. Summary  Vertigo is the feeling that you or your surroundings are moving when they are not. Benign positional vertigo is the most common form of vertigo.  The cause of this condition is not known. It may be caused by a disturbance in an area of the inner ear that helps your brain to sense movement and balance.  Symptoms include loss of balance and falling, feeling that you or your surroundings are moving, nausea and vomiting, and blurred vision.  This condition can be diagnosed based on symptoms, physical exam, and other tests, such as MRI, CT scan, eye movement tests, and hearing tests.  Follow safety instructions as told by your health care provider. You will also be told when to contact your health care provider in case of problems. This information is not intended to replace advice given to you by your health care provider. Make sure you discuss any questions you have with your health care provider. Document Released: 02/05/2006 Document Revised: 10/09/2017 Document Reviewed: 10/09/2017 Elsevier Patient Education  2020 [employer].

## 2019-02-03 NOTE — Progress Notes (Signed)
Based on what you shared with me, I feel your condition warrants further evaluation and I recommend that you be seen for a face to face office visit.  I reviewed your chart; as your symptoms are ongoing despite initial treatment by your PCP, your symptoms would best be managed by face-to-face evaluation today. Your PCP was concerned that you may be having a medication reaction, so your symptoms should have resolved by now with the discontinuation of your lisinopril. The added/new symptoms of dizziness could be related to the rash or could indicate something else (such as your blood pressure being elevated).    NOTE: If you entered your credit card information for this eVisit, you will not be charged. You may see a "hold" on your card for the $35 but that hold will drop off and you will not have a charge processed.  If you are having a true medical emergency please call 911.     For an urgent face to face visit, [place] has four urgent care centers for your convenience:   . [facility]    [phone]                  Get Driving Directions  [address], [place] [address] . 10 am to 8 pm Monday-Friday . 12 pm to 8 pm Saturday-Sunday   . [facility] at [place]                  Get Driving Directions  [address], [place], [place] [address] . 8 am to 8 pm Monday-Friday . 9 am to 6 pm Saturday . 11 am to 6 pm Sunday   . [facility] at [place]                  Get Driving Directions   [address], [place] [address] . 8 am to 8 pm Monday-Friday . 8 am to 4 pm Saturday-Sunday    . [facility] at [place]                    Get Driving Directions  [phone]  [address], [place], [place] [address]  . Monday-Friday, 12 PM to 6 PM    Your e-visit answers were reviewed by a board certified advanced clinical  practitioner to complete your personal care plan.  Thank you for using e-Visits.  Greater than 5 minutes, yet less than 10 minutes of time have been spent researching, coordinating, and implementing care for this patient today.

## 2019-02-03 NOTE — Assessment & Plan Note (Signed)
BP elevated and since rash is not resolved with stopping lisinopril/hctz will resume. Rx sent in today for 20/12.5 mg. Will stop amlodipine to see if this is causing rash.

## 2019-02-03 NOTE — Progress Notes (Signed)
   Subjective:   Patient ID: Hayley [name], female    DOB: 07-08-1980, 38 y.o.   MRN: [id number]  HPI The patient is a 38 YO female coming in for concerns about rash (started in the last 6 months, seen and taken off lisinopril/hctz about 3 weeks ago and did not help, patchy and comes and goes, not on hands or feet, not on face, no mouth or facial swelling, denies medication change, diet change, product change like soap or shampoo or fragrence) and dizziness (started in the last 2-3 weeks, worse with turning head or when tying shoes, has not taken anything for this except otc motion sickness medication which helped a little, has had an episode of this in the past which we thought was related to medication at the time, denies headaches or vision changes, denies ear pain, does have mother with meniere's disease, denies hearing changes).   Review of Systems  Constitutional: Negative.   HENT: Negative.   Eyes: Negative.   Respiratory: Negative for cough, chest tightness and shortness of breath.   Cardiovascular: Negative for chest pain, palpitations and leg swelling.  Gastrointestinal: Negative for abdominal distention, abdominal pain, constipation, diarrhea, nausea and vomiting.  Musculoskeletal: Negative.   Skin: Positive for rash.  Neurological: Positive for dizziness.  Psychiatric/Behavioral: Negative.     Objective:  Physical Exam Constitutional:      Appearance: She is well-developed.  HENT:     Head: Normocephalic and atraumatic.  Neck:     Musculoskeletal: Normal range of motion.  Cardiovascular:     Rate and Rhythm: Normal rate and regular rhythm.  Pulmonary:     Effort: Pulmonary effort is normal. No respiratory distress.     Breath sounds: Normal breath sounds. No wheezing or rales.  Abdominal:     General: Bowel sounds are normal. There is no distension.     Palpations: Abdomen is soft.     Tenderness: There is no abdominal tenderness. There is no rebound.  Skin:  General: Skin is warm and dry.  Neurological:     Mental Status: She is alert and oriented to person, place, and time.     Coordination: Coordination normal.     Vitals:   02/03/19 1056  BP: (!) 144/100  Pulse: (!) 108  Temp: 98.2 F (36.8 C)  TempSrc: Oral  SpO2: 97%  Weight: 156 lb (70.8 kg)  Height: 5' (1.524 m)    Assessment & Plan:

## 2019-02-03 NOTE — Assessment & Plan Note (Signed)
Unclear if related. Can use benadryl at night time. Stop amlodipine. Resume lisinopril/hctz since it has not helped to stop this and BP is elevated. Use zyrtec TID. Stop singulair since it has not helped and she stopped doxepin due to side effects on her own.

## 2019-02-09 ENCOUNTER — Encounter: Payer: Self-pay | Admitting: Internal Medicine

## 2019-02-09 ENCOUNTER — Other Ambulatory Visit: Payer: Self-pay | Admitting: Internal Medicine

## 2019-02-09 DIAGNOSIS — B86 Scabies: Secondary | ICD-10-CM

## 2019-02-09 MED ORDER — PERMETHRIN 5 % EX CREA: 1.0000 "application " | TOPICAL_CREAM | Freq: Once | CUTANEOUS | 1 refills | Status: AC

## 2019-02-09 NOTE — Progress Notes (Unsigned)
sacabies

## 2019-02-26 ENCOUNTER — Encounter: Payer: Self-pay | Admitting: Internal Medicine

## 2019-03-17 ENCOUNTER — Ambulatory Visit (INDEPENDENT_AMBULATORY_CARE_PROVIDER_SITE_OTHER): Payer: BC Managed Care – PPO | Admitting: Clinical

## 2019-03-17 ENCOUNTER — Other Ambulatory Visit: Payer: Self-pay

## 2019-03-17 ENCOUNTER — Encounter: Payer: Self-pay | Admitting: Advanced Practice Midwife

## 2019-03-17 ENCOUNTER — Ambulatory Visit (INDEPENDENT_AMBULATORY_CARE_PROVIDER_SITE_OTHER): Payer: BC Managed Care – PPO | Admitting: Advanced Practice Midwife

## 2019-03-17 DIAGNOSIS — Z23 Encounter for immunization: Secondary | ICD-10-CM

## 2019-03-17 DIAGNOSIS — F419 Anxiety disorder, unspecified: Secondary | ICD-10-CM

## 2019-03-17 DIAGNOSIS — Z0000 Encounter for general adult medical examination without abnormal findings: Secondary | ICD-10-CM

## 2019-03-17 MED ORDER — SERTRALINE HCL 50 MG PO TABS: 50.0000 mg | ORAL_TABLET | Freq: Every day | ORAL | 3 refills | Status: DC

## 2019-03-17 MED ORDER — LORAZEPAM 1 MG PO TABS: 0.5000 mg | ORAL_TABLET | Freq: Three times a day (TID) | ORAL | 0 refills | Status: DC

## 2019-03-17 NOTE — BH Specialist Note (Addendum)
Integrated Behavioral Health Initial Visit  MRN: [id number] Name: Hayley [name]  Number of [facility] Clinician visits:: 1/6 Session Start time:2:30  Session End time: 3:20 Total time: 50   Type of Service: [place] Interpretor:No. Interpretor Name and Language: n/a   Warm Hand Off Completed.       SUBJECTIVE: Hayley [name] is a 38 y.o. female accompanied by n/a Patient was referred by Marcille Buffy, CNM for anxiety. 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 Pt is open to learning additional coping strategies, along with medication today.  Duration of problem: Increase in pandemic; Severity of problem: severe  OBJECTIVE: Mood: Anxious and Affect: Appropriate Risk of harm to self or others: No plan to harm self or others  LIFE CONTEXT: Family and Social: Pt has two children (6yo; 66yo) School/Work: Works fulltime as a Chief of Staff: Recognizing a greater need for self-care Life Changes: Current pandemic; numerous work Museum/gallery conservator  GOALS ADDRESSED: Patient will: 1. Reduce symptoms of: anxiety and stress 2. Increase knowledge and/or ability of: coping skills and stress reduction  3. Demonstrate ability to: Increase healthy adjustment to current life circumstances  INTERVENTIONS: Interventions utilized: Mindfulness or Psychologist, educational, Brief CBT and Psychoeducation and/or Health Education  Standardized Assessments completed: GAD-7 and PHQ 9  ASSESSMENT: Patient currently experiencing Anxiety disorder, unspecified    Patient may benefit from psychoeducation and brief therapeutic interventions regarding  coping with symptoms of anxiety.  PLAN: 1. Follow up with behavioral health clinician on : One week via phone 2. Behavioral recommendations:  -Begin taking medication as prescribed today -Continue using self-coping strategies that have helped in the past. Add the following:  -CALM relaxation breathing exercise twice daily (morning; at bedtime) -Begin implementing Worry Time strategy daily -Read educational materials regarding coping with symptoms of anxiety   3. Referral(s): [facility] (In Clinic) 4. "From scale of 1-10, how likely are you to follow plan?": 10  Garlan Fair, LCSW   Depression screen [facility] 2/9 03/17/2019 01/21/2019 10/31/2017 08/12/2014  Decreased Interest 1 0 0 0  Down, Depressed, Hopeless 1 0 0 0  PHQ - 2 Score 2 0 0 0  Altered sleeping 2 - - -  Tired, decreased energy 1 - - -  Change in appetite 1 - - -  Feeling bad or failure about yourself  0 - - -  Trouble concentrating 3 - - -  Moving slowly or fidgety/restless 0 - - -  Suicidal thoughts 0 - - -  PHQ-9 Score 9 - - -   GAD 7 : Generalized Anxiety Score 03/17/2019  Nervous, Anxious, on Edge 3  Control/stop worrying 3  Worry too much - different things 3  Trouble relaxing 3  Restless 3  Easily annoyed or irritable 2  Afraid - awful might happen 2  Total GAD 7 Score 19

## 2019-03-17 NOTE — Progress Notes (Signed)
Flu

## 2019-03-17 NOTE — Progress Notes (Signed)
  Subjective:     Patient ID: Hayley [name], female   DOB: 17-Dec-1980, 38 y.o.   MRN: [id number]  Anxiety Presents for initial visit. Onset was 1 to 4 weeks ago. The problem has been unchanged. Symptoms include decreased concentration, excessive worry, insomnia, irritability, muscle tension and nervous/anxious behavior. Symptoms occur most days. The severity of symptoms is interfering with daily activities. The symptoms are aggravated by work stress (covid pandemic ). The quality of sleep is fair. Nighttime awakenings: occasional.   Past treatments include lifestyle changes.     Review of Systems  Constitutional: Positive for irritability.  Psychiatric/Behavioral: Positive for decreased concentration. The patient is nervous/anxious and has insomnia.   All other systems reviewed and are negative.      Objective:   Physical Exam Vitals signs and nursing note reviewed.  Constitutional:      General: She is not in acute distress.    Appearance: Normal appearance.  HENT:     Head: Normocephalic.  Cardiovascular:     Rate and Rhythm: Normal rate.  Pulmonary:     Effort: Pulmonary effort is normal.  Neurological:     General: No focal deficit present.     Mental Status: She is alert and oriented to person, place, and time.  Psychiatric:        Mood and Affect: Mood normal.        Assessment:     1. Healthcare maintenance   2. Anxiety    Plan:     Patient saw Roselyn Reef today Roselyn Reef will FU with her next week Start Zoloft 25mg  daily and increase to 50mg  after one week.  Will follow up with patient after 2 weeks to assess medication response  Short term ativan rx given for acute panic  .Marcille Buffy DNP, CNM  03/17/19  4:08 PM

## 2019-03-17 NOTE — Patient Instructions (Addendum)
Coping with Panic Attacks   What is a panic attack?  You may have had a panic attack if you experienced four or more of the symptoms listed below coming on abruptly and peaking in about 10 minutes.  Panic Symptoms    Pounding heart   Sweating   Trembling or shaking   Shortness of breath   Feeling of choking   Chest pain   Nausea or abdominal distress     Feeling dizzy, unsteady, lightheaded, or faint   Feelings of unreality or being detached from yourself   Fear of losing control or going crazy   Fear of dying   Numbness or tingling   Chills or hot flashes      Panic attacks are sometimes accompanied by avoidance of certain places or situations. These are often situations that would be difficult to escape from or in which help might not be available. Examples might include crowded shopping malls, public transportation, restaurants, or driving.   Why do panic attacks occur?   Panic attacks are the body's alarm system gone awry. All of us have a built-in alarm system, powered by adrenaline, which increases our heart rate, breathing, and blood flow in response to danger. Ordinarily, this 'danger response system' works well. In some people, however, the response is either out of proportion to whatever stress is going on, or may come out of the blue without any stress at all.   For example, if you are walking in the woods and see a bear coming your way, a variety of changes occur in your body to prepare you to either fight the danger or flee from the situation. Your heart rate will increase to get more blood flow around your body, your breathing rate will quicken so that more oxygen is available, and your muscles will tighten in order to be ready to fight or run. You may feel nauseated as blood flow leaves your stomach area and moves into your limbs. These bodily changes are all essential to helping you survive the dangerous situation. After the danger has passed, your body  functions will begin to go back to normal. This is because your body also has a system for "recovering" by bringing your body back down to a normal state when the danger is over.   As you can see, the emergency response system is adaptive when there is, in fact, a "true" or "real" danger (e.g., bear). However, sometimes people find that their emergency response system is triggered in "everyday" situations where there really is no true physical danger (e.g., in a meeting, in the grocery store, while driving in normal traffic, etc.).   What triggers a panic attack?  Sometimes particularly stressful situations can trigger a panic attack. For example, an argument with your spouse or stressors at work can cause a stress response (activating the emergency response system) because you perceive it as threatening or overwhelming, even if there is no direct risk to your survival.  Sometimes panic attacks don't seem to be triggered by anything in particular- they may "come out of the blue". Somehow, the natural "fight or flight" emergency response system has gotten activated when there is no real danger. Why does the body go into "emergency mode" when there is no real danger?   Often, people with panic attacks are frightened or alarmed by the physical sensations of the emergency response system. First, unexpected physical sensations are experienced (tightness in your chest or some shortness of breath). This then  leads to feeling fearful or alarmed by these symptoms ("Something's wrong!", "Am I having a heart attack?", "Am I going to faint?") The mind perceives that there is a danger even though no real danger exists. This, in turn, activates the emergency response system ("fight or flight"), leading to a "full blown" panic attack. In summary, panic attacks occur when we misinterpret physical symptoms as signs of impending death, craziness, loss of control, embarrassment, or fear of fear. Sometimes you may be aware of  thoughts of danger that activate the emergency response system (for example, thinking "I'm having a heart attack" when you feel chest pressure or increased heart rate). At other times, however, you may not be aware of such thoughts. After several incidences of being afraid of physical sensations, anxiety and panic can occur in response to the initial sensations without conscious thoughts of danger. Instead, you just feel afraid or alarmed. In other words, the panic or fear may seem to occur "automatically" without you consciously telling yourself anything.   After having had one or more panic attacks, you may also become more focused on what is going on inside your body. You may scan your body and be more vigilant about noticing any symptoms that might signal the start of a panic attack. This makes it easier for panic attacks to happen again because you pick up on sensations you might otherwise not have noticed, and misinterpret them as something dangerous. A panic attack may then result.      How do I cope with panic attacks?  An important part of overcoming panic attacks involves re-interpreting your body's physical reactions and teaching yourself ways to decrease the physical arousal. This can be done through practicing the cognitive and behavioral interventions below.   Research has found that over half of people who have panic attacks show some signs of hyperventilation or overbreathing. This can produce initial sensations that alarm you and lead to a panic attack. Overbreathing can also develop as part of the panic attack and make the symptoms worse. When people hyperventilate, certain blood vessels in the body become narrower. In particular, the brain may get slightly less oxygen. This can lead to the symptoms of dizziness, confusion, and lightheadedness that often occur during panic attacks. Other parts of the body may also get a bit less oxygen, which may lead to numbness or tingling in the hands  or feet or the sensation of cold, clammy hands. It also may lead the heart to pump harder. Although these symptoms may be frightening and feel unpleasant, it is important to remember that hyperventilating is not dangerous. However, you can help overcome the unpleasantness of overbreathing by practicing Breathing Retraining.   Practice this basic technique three times a day, every day:   Inhale. With your shoulders relaxed, inhale as slowly and deeply as you can while you count to six. If you can, use your diaphragm to fill your lungs with air.   Hold. Keep the air in your lungs as you slowly count to four.   Exhale. Slowly breath out as you count to six.   Repeat. Do the inhale-hold-exhale cycle several times. Each time you do it, exhale for longer counts.  Like any new skill, Breathing Retraining requires practice. Try practicing this skill twice a day for several minutes. Initially, do not try this technique in specific situations or when you become frightened or have a panic attack. Begin by practicing in a quiet environment to build up your skill level so  that you can later use it in time of "emergency."   2. Decreasing Avoidance  Regardless of whether you can identify why you began having panic attacks or whether they seemed to come out of the blue, the places where you began having panic attacks often can become triggers themselves. It is not uncommon for individuals to begin to avoid the places where they have had panic attacks. Over time, the individual may begin to avoid more and more places, thereby decreasing their activities and often negatively impacting their quality of life. To break the cycle of avoidance, it is important to first identify the places or situations that are being avoided, and then to do some "relearning."  To begin this intervention, first create a list of locations or situations that you tend to avoid. Then choose an avoided location or situation that you would like  to target first. Now develop an "exposure hierarchy" for this situation or location. An "exposure hierarchy" is a list of actions that make you feel anxious in this situation. Order these actions from least to most anxiety-producing. It is often helpful to have the first item on your hierarchy involve thinking or imagining part of the feared/avoided situation.   Here is an example of an exposure hierarchy for decreasing avoidance of the grocery store. Note how it is ordered from the least amount of anxiety (at the top) to the most anxiety (at the bottom):   Think about going to the grocery store alone.   Go to the grocery store with a friend or family member.   Go to the grocery store alone to pick up a few small items (5-10 minutes in the store).   Shopping for 10-20 minutes in the store alone.   Doing the shopping for the week by myself (20-30 minutes in the store).   Your homework is to "expose" yourself to the lowest item on your hierarchy and use your breathing relaxation and coping statements (see below) to help you remain in the situation. Practice this several times during the upcoming week. Once you have mastered each item with minimal anxiety, move on to the next higher action on your list.   Cognitive Interventions  1. Identify your negative self-talk Anxious thoughts can increase anxiety symptoms and panic. The first step in changing anxious thinking is to identify your own negative, alarming self-talk. Some common alarming thoughts:   I'm having a heart attack.             I must be going crazy.  I think I'm dying.  People will think I'm crazy.  I'm going to pass our.   Oh no- here it comes.   I can't stand this.   I've got to get out of here!  2. Use positive coping statements Changing or disrupting a pattern of anxious thoughts by replacing them with more calming or supportive statements can help to divert a panic attack. Some common helpful coping statements:    This is not an emergency.   I don't like feeling this way, but I can accept it.   I can feel like this and still be okay.   This has happened before, and I was okay. I'll be okay this time, too.   I can be anxious and still deal with this situation.    -------------------------------------------------------------------------------------------------------------------------------------------------------------------  At [facility], we work as an integrated team, providing care to address both physical and emotional health. Your medical provider may refer you to see  our [facility]) on the same day you see your medical provider, as availability permits.  Our [facility] is available to all patients, visits generally last between 20-30 minutes, but can be longer or shorter, depending on patient need. The [facility] offers help with stress management, coping with symptoms of depression and anxiety, major life changes , sleep issues, changing risky behavior, grief and loss, life stress, working on personal life goals, and  behavioral health issues, as these all affect your overall health and wellness.  The [facility] is NOT available for the following: court-ordered evaluations, specialty assessments (custody or disability), letters to employers, or obtaining certification for an emotional support animal. The [facility] does not provide long-term therapeutic services. You have the right to refuse integrated behavioral health services, or to reschedule to see the [facility] at a later date.  Exception: If you are having thoughts of suicide, we require that you either see the [facility] for further assessment, or contract for safety with your medical provider prior to checking out.  Confidentiality exception: If it is suspected that a child or disabled adult is being abused or neglected, we are required by law to report that to either Child Protective Services or Adult Scientist, forensic.  If you have a  diagnosis of Bipolar affective disorder, Schizophrenia, or recurrent Major depressive disorder, we will recommend that you establish care with a psychiatrist, as these are lifelong, chronic conditions, and we want your overall emotional health and medications to be more closely monitored. If you anticipate needing extended maternity leave due to mental illness, it it recommended that you find a psychiatrist as soon as possible. Neither the medical provider, nor the [facility], can recommend an extended maternity leave for mental health issues. Your medical provider or [facility] may refer you to a therapist for ongoing, traditional therapy, or to a psychiatrist, for medication management, if it would benefit your overall health. Depending on your insurance, you may have a copay to see the [facility]. If you are uninsured, it is recommended that you apply for financial assistance. (Forms may be requested at the front desk for in-person visits, via MyChart, or request a form during a virtual visit).  If you see the [facility] more than 6 times, you will have to complete a comprehensive clinical assessment interview with the [facility] to resume integrated services.  Any questions?

## 2019-05-02 ENCOUNTER — Other Ambulatory Visit: Payer: Self-pay | Admitting: Internal Medicine

## 2019-05-02 DIAGNOSIS — L500 Allergic urticaria: Secondary | ICD-10-CM

## 2019-05-02 DIAGNOSIS — T50905A Adverse effect of unspecified drugs, medicaments and biological substances, initial encounter: Secondary | ICD-10-CM

## 2019-05-12 ENCOUNTER — Other Ambulatory Visit: Payer: Self-pay

## 2019-05-12 ENCOUNTER — Encounter: Payer: Self-pay | Admitting: Internal Medicine

## 2019-05-14 MED ORDER — LORAZEPAM 1 MG PO TABS: 0.5000 mg | ORAL_TABLET | Freq: Three times a day (TID) | ORAL | 0 refills | Status: DC

## 2019-05-16 ENCOUNTER — Other Ambulatory Visit: Payer: Self-pay | Admitting: Internal Medicine

## 2019-05-16 DIAGNOSIS — T50905A Adverse effect of unspecified drugs, medicaments and biological substances, initial encounter: Secondary | ICD-10-CM

## 2019-05-16 DIAGNOSIS — L500 Allergic urticaria: Secondary | ICD-10-CM

## 2019-06-18 ENCOUNTER — Other Ambulatory Visit: Payer: Self-pay | Admitting: Advanced Practice Midwife

## 2019-06-19 ENCOUNTER — Ambulatory Visit: Payer: BC Managed Care – PPO | Attending: Internal Medicine

## 2019-06-19 ENCOUNTER — Other Ambulatory Visit: Payer: Self-pay | Admitting: Advanced Practice Midwife

## 2019-06-19 DIAGNOSIS — Z20822 Contact with and (suspected) exposure to covid-19: Secondary | ICD-10-CM

## 2019-06-20 LAB — NOVEL CORONAVIRUS, NAA: SARS-CoV-2, NAA: NOT DETECTED

## 2019-07-01 ENCOUNTER — Other Ambulatory Visit: Payer: Self-pay | Admitting: Advanced Practice Midwife

## 2019-07-01 MED ORDER — LORAZEPAM 1 MG PO TABS: 0.5000 mg | ORAL_TABLET | Freq: Three times a day (TID) | ORAL | 0 refills | Status: DC

## 2019-07-01 MED ORDER — SERTRALINE HCL 100 MG PO TABS: 100.0000 mg | ORAL_TABLET | Freq: Every day | ORAL | 6 refills | Status: DC

## 2019-07-01 NOTE — Telephone Encounter (Signed)
Called the patient to confirm the upcoming visit per the request of Thressa Sheller. The line answered however no one responded. After repeating hello 4x the call was disconnect. Mailing the patient an appointment reminder letter and sending a message via mychart.

## 2019-07-01 NOTE — Progress Notes (Signed)
Spoke with patient via phone. Noticing some improvement in anxiety, but still having a lot of anxiety symptoms. Will increase zoloft dose. RX sent to pharmacy. Will send [employer] [employer] a message for a virtual visit with [employer].    Thressa Sheller DNP, CNM  07/01/19  10:09 AM

## 2019-07-09 ENCOUNTER — Ambulatory Visit: Payer: BC Managed Care – PPO | Attending: Family

## 2019-07-09 ENCOUNTER — Ambulatory Visit (INDEPENDENT_AMBULATORY_CARE_PROVIDER_SITE_OTHER): Payer: BC Managed Care – PPO | Admitting: Clinical

## 2019-07-09 DIAGNOSIS — Z23 Encounter for immunization: Secondary | ICD-10-CM | POA: Insufficient documentation

## 2019-07-09 DIAGNOSIS — F419 Anxiety disorder, unspecified: Secondary | ICD-10-CM | POA: Diagnosis not present

## 2019-07-09 NOTE — Progress Notes (Signed)
   Covid-19 Vaccination Clinic  Name:  Hayley [name]    MRN: [id number] DOB: Feb 03, 1981  07/09/2019  [name] was observed post Covid-19 immunization for 15 minutes without incidence. She was provided with Vaccine Information Sheet and instruction to access the V-Safe system.   [name] was instructed to call 911 with any severe reactions post vaccine: Marland Kitchen Difficulty breathing  . Swelling of your face and throat  . A fast heartbeat  . A bad rash all over your body  . Dizziness and weakness    Immunizations Administered    Name Date Dose VIS Date Route   Moderna COVID-19 Vaccine 07/09/2019 12:04 PM 0.5 mL 04/14/2019 Intramuscular   Manufacturer: Moderna   Lot: [id number]   NDC: 51833-582-51

## 2019-07-09 NOTE — BH Specialist Note (Signed)
Integrated Behavioral Health via Telemedicine Video Visit  07/09/2019 ZAKIYA SPORRER [id number]  Number of Integrated Behavioral Health visits: 2 Session Start time: 3:19  Session End time: 3:50 Total time: 31  Referring Provider: Thressa Sheller, CNM Type of Visit: Video Patient/Family location: Home [facility] Provider location: [facility] All persons participating in visit: Patient Hayley [name] and [facility] Ricki Clack    Confirmed patient's address: Yes  Confirmed patient's phone number: Yes  Any changes to demographics: No   Confirmed patient's insurance: Yes  Any changes to patient's insurance: No   Discussed confidentiality: at previous visit  I connected with [name]  by a video enabled telemedicine application and verified that I am speaking with the correct person using two identifiers.     I discussed the limitations of evaluation and management by telemedicine and the availability of in person appointments.  I discussed that the purpose of this visit is to provide behavioral health care while limiting exposure to the novel coronavirus.   Discussed there is a possibility of technology failure and discussed alternative modes of communication if that failure occurs.  I discussed that engaging in this video visit, they consent to the provision of behavioral healthcare and the services will be billed under their insurance.  Patient and/or legal guardian expressed understanding and consented to video visit: Yes   PRESENTING CONCERNS: Patient and/or family reports the following symptoms/concerns: Pt states feeling less anxiety and panic attacks with medication, self-coping strategies and knowing she will obtain second covid vaccine end of March; has felt some uncertainty about increasing Zoloft to 100mg  as prescribed, but will start today.  Duration of problem: Increase during pandemic; Severity of problem: moderate  STRENGTHS (Protective Factors/Coping Skills): Supportive family,  self-awareness, and adhering to treatment plan  GOALS ADDRESSED: Patient will: 1.  Reduce symptoms of: anxiety and stress  2.  Increase knowledge and/or ability of: stress reduction  3.  Demonstrate ability to: Increase healthy adjustment to current life circumstances and Improve medication compliance  INTERVENTIONS: Interventions utilized:  Medication Monitoring and Psychoeducation and/or Health Education Standardized Assessments completed: Not given today  ASSESSMENT: Patient currently experiencing Anxiety disorder, unspecified.   Patient may benefit from continued psychoeducation and brief therapeutic interventions regarding coping with symptoms of anxiety and life stress .  PLAN: 1. Follow up with behavioral health clinician on : As needed 2. Behavioral recommendations:  -Take BH medications as prescribed -Continue using daily self-coping strategies  -Continue with plan to take second covid vaccine for peace of mind -Follow through with summer vacation plan; enjoy every moment 3. Referral(s): Integrated (In Clinic)  I discussed the assessment and treatment plan with the patient and/or parent/guardian. They were provided an opportunity to ask questions and all were answered. They agreed with the plan and demonstrated an understanding of the instructions.   They were advised to call back or seek an in-person evaluation if the symptoms worsen or if the condition fails to improve as anticipated.  [employer] Joyce Leckey

## 2019-07-16 ENCOUNTER — Other Ambulatory Visit: Payer: Self-pay | Admitting: Advanced Practice Midwife

## 2019-08-11 ENCOUNTER — Ambulatory Visit: Payer: BC Managed Care – PPO | Attending: Family

## 2019-08-11 DIAGNOSIS — Z23 Encounter for immunization: Secondary | ICD-10-CM

## 2019-08-11 NOTE — Progress Notes (Signed)
   Covid-19 Vaccination Clinic  Name:  KAYTE BORCHARD    MRN: [id number] DOB: January 03, 1981  08/11/2019  Ms. Pongratz was observed post Covid-19 immunization for 15 minutes without incident. She was provided with Vaccine Information Sheet and instruction to access the V-Safe system.   Ms. Lavalle was instructed to call 911 with any severe reactions post vaccine: Marland Kitchen Difficulty breathing  . Swelling of face and throat  . A fast heartbeat  . A bad rash all over body  . Dizziness and weakness   Immunizations Administered    Name Date Dose VIS Date Route   Moderna COVID-19 Vaccine 08/11/2019 12:10 PM 0.5 mL 04/14/2019 Intramuscular   Manufacturer: Moderna   Lot: [id number]   NDC: 32992-426-83

## 2019-08-25 ENCOUNTER — Other Ambulatory Visit: Payer: Self-pay | Admitting: Advanced Practice Midwife

## 2019-08-25 NOTE — Telephone Encounter (Signed)
This was sent to me by mistake.  Thank you,  Breannah Kratt Civil engineer, contracting)

## 2019-08-26 MED ORDER — LORAZEPAM 1 MG PO TABS: 0.5000 mg | ORAL_TABLET | Freq: Three times a day (TID) | ORAL | 0 refills | Status: DC

## 2019-09-30 ENCOUNTER — Other Ambulatory Visit: Payer: Self-pay | Admitting: Advanced Practice Midwife

## 2019-09-30 MED ORDER — LORAZEPAM 1 MG PO TABS: 0.5000 mg | ORAL_TABLET | Freq: Three times a day (TID) | ORAL | 0 refills | Status: DC

## 2019-10-26 ENCOUNTER — Other Ambulatory Visit: Payer: Self-pay | Admitting: Internal Medicine

## 2019-11-13 ENCOUNTER — Other Ambulatory Visit: Payer: Self-pay | Admitting: Internal Medicine

## 2019-12-01 ENCOUNTER — Other Ambulatory Visit: Payer: Self-pay | Admitting: Advanced Practice Midwife

## 2019-12-11 ENCOUNTER — Other Ambulatory Visit: Payer: Self-pay | Admitting: Internal Medicine

## 2019-12-14 ENCOUNTER — Encounter: Payer: Self-pay | Admitting: Internal Medicine

## 2019-12-14 ENCOUNTER — Other Ambulatory Visit: Payer: Self-pay

## 2019-12-14 ENCOUNTER — Ambulatory Visit (INDEPENDENT_AMBULATORY_CARE_PROVIDER_SITE_OTHER): Payer: BC Managed Care – PPO | Admitting: Internal Medicine

## 2019-12-14 VITALS — BP 124/76 | HR 88 | Temp 98.5°F | Ht 60.0 in | Wt 155.0 lb

## 2019-12-14 DIAGNOSIS — I10 Essential (primary) hypertension: Secondary | ICD-10-CM | POA: Diagnosis not present

## 2019-12-14 DIAGNOSIS — Z0000 Encounter for general adult medical examination without abnormal findings: Secondary | ICD-10-CM

## 2019-12-14 MED ORDER — TRIAMCINOLONE ACETONIDE 0.1 % EX CREA
1.0000 | TOPICAL_CREAM | Freq: Two times a day (BID) | CUTANEOUS | 3 refills | Status: DC
Start: 2019-12-14 — End: 2020-03-24

## 2019-12-14 MED ORDER — AMLODIPINE BESYLATE 10 MG PO TABS: 10.0000 mg | ORAL_TABLET | Freq: Every day | ORAL | 3 refills | Status: DC

## 2019-12-14 NOTE — Progress Notes (Signed)
   Subjective:   Patient ID: Hayley [name], female    DOB: 10-Jun-1980, 39 y.o.   MRN: [id number]  HPI The patient is a 39 YO female coming in for physical.   PMH, FMH, social history reviewed and updated  Review of Systems  Constitutional: Negative.   HENT: Negative.   Eyes: Negative.   Respiratory: Negative for cough, chest tightness and shortness of breath.   Cardiovascular: Negative for chest pain, palpitations and leg swelling.  Gastrointestinal: Negative for abdominal distention, abdominal pain, constipation, diarrhea, nausea and vomiting.  Musculoskeletal: Negative.   Skin: Negative.   Neurological: Negative.   Psychiatric/Behavioral: Negative.     Objective:  Physical Exam Constitutional:      Appearance: She is well-developed.  HENT:     Head: Normocephalic and atraumatic.  Cardiovascular:     Rate and Rhythm: Normal rate and regular rhythm.  Pulmonary:     Effort: Pulmonary effort is normal. No respiratory distress.     Breath sounds: Normal breath sounds. No wheezing or rales.  Abdominal:     General: Bowel sounds are normal. There is no distension.     Palpations: Abdomen is soft.     Tenderness: There is no abdominal tenderness. There is no rebound.  Musculoskeletal:     Cervical back: Normal range of motion.  Skin:    General: Skin is warm and dry.  Neurological:     Mental Status: She is alert and oriented to person, place, and time.     Coordination: Coordination normal.     Vitals:   12/14/19 1321  BP: 124/76  Pulse: 88  Temp: 98.5 F (36.9 C)  TempSrc: Oral  SpO2: 99%  Weight: 155 lb (70.3 kg)  Height: 5' (1.524 m)    This visit occurred during the SARS-CoV-2 public health emergency.  Safety protocols were in place, including screening questions prior to the visit, additional usage of staff PPE, and extensive cleaning of exam room while observing appropriate contact time as indicated for disinfecting solutions.   Assessment & Plan:

## 2019-12-14 NOTE — Patient Instructions (Addendum)
We have sent in the cream to use for the hands twice a day.    Health Maintenance, Female Adopting a healthy lifestyle and getting preventive care are important in promoting health and wellness. Ask your health care provider about:  The right schedule for you to have regular tests and exams.  Things you can do on your own to prevent diseases and keep yourself healthy. What should I know about diet, weight, and exercise? Eat a healthy diet   Eat a diet that includes plenty of vegetables, fruits, low-fat dairy products, and lean protein.  Do not eat a lot of foods that are high in solid fats, added sugars, or sodium. Maintain a healthy weight Body mass index (BMI) is used to identify weight problems. It estimates body fat based on height and weight. Your health care provider can help determine your BMI and help you achieve or maintain a healthy weight. Get regular exercise Get regular exercise. This is one of the most important things you can do for your health. Most adults should:  Exercise for at least 150 minutes each week. The exercise should increase your heart rate and make you sweat (moderate-intensity exercise).  Do strengthening exercises at least twice a week. This is in addition to the moderate-intensity exercise.  Spend less time sitting. Even light physical activity can be beneficial. Watch cholesterol and blood lipids Have your blood tested for lipids and cholesterol at 41 years of age, then have this test every 5 years. Have your cholesterol levels checked more often if:  Your lipid or cholesterol levels are high.  You are older than 41 years of age.  You are at high risk for heart disease. What should I know about cancer screening? Depending on your health history and family history, you may need to have cancer screening at various ages. This may include screening for:  Breast cancer.  Cervical cancer.  Colorectal cancer.  Skin cancer.  Lung cancer. What  should I know about heart disease, diabetes, and high blood pressure? Blood pressure and heart disease  High blood pressure causes heart disease and increases the risk of stroke. This is more likely to develop in people who have high blood pressure readings, are of African descent, or are overweight.  Have your blood pressure checked: ? Every 3-5 years if you are 24-38 years of age. ? Every year if you are 68 years old or older. Diabetes Have regular diabetes screenings. This checks your fasting blood sugar level. Have the screening done:  Once every three years after age 81 if you are at a normal weight and have a low risk for diabetes.  More often and at a younger age if you are overweight or have a high risk for diabetes. What should I know about preventing infection? Hepatitis B If you have a higher risk for hepatitis B, you should be screened for this virus. Talk with your health care provider to find out if you are at risk for hepatitis B infection. Hepatitis C Testing is recommended for:  Everyone born from [birth year] through 1967.  Anyone with known risk factors for hepatitis C. Sexually transmitted infections (STIs)  Get screened for STIs, including gonorrhea and chlamydia, if: ? You are sexually active and are younger than 41 years of age. ? You are older than 41 years of age and your health care provider tells you that you are at risk for this type of infection. ? Your sexual activity has changed since you were last  screened, and you are at increased risk for chlamydia or gonorrhea. Ask your health care provider if you are at risk.  Ask your health care provider about whether you are at high risk for HIV. Your health care provider may recommend a prescription medicine to help prevent HIV infection. If you choose to take medicine to prevent HIV, you should first get tested for HIV. You should then be tested every 3 months for as long as you are taking the medicine. Pregnancy  If  you are about to stop having your period (premenopausal) and you may become pregnant, seek counseling before you get pregnant.  Take 400 to 800 micrograms (mcg) of folic acid every day if you become pregnant.  Ask for birth control (contraception) if you want to prevent pregnancy. Osteoporosis and menopause Osteoporosis is a disease in which the bones lose minerals and strength with aging. This can result in bone fractures. If you are 43 years old or older, or if you are at risk for osteoporosis and fractures, ask your health care provider if you should:  Be screened for bone loss.  Take a calcium or vitamin D supplement to lower your risk of fractures.  Be given hormone replacement therapy (HRT) to treat symptoms of menopause. Follow these instructions at home: Lifestyle  Do not use any products that contain nicotine or tobacco, such as cigarettes, e-cigarettes, and chewing tobacco. If you need help quitting, ask your health care provider.  Do not use street drugs.  Do not share needles.  Ask your health care provider for help if you need support or information about quitting drugs. Alcohol use  Do not drink alcohol if: ? Your health care provider tells you not to drink. ? You are pregnant, may be pregnant, or are planning to become pregnant.  If you drink alcohol: ? Limit how much you use to 0-1 drink a day. ? Limit intake if you are breastfeeding.  Be aware of how much alcohol is in your drink. In the [place] one drink equals one 12 oz bottle of beer (355 mL), one 5 oz glass of wine (148 mL), or one 1 oz glass of hard liquor (44 mL). General instructions  Schedule regular health, dental, and eye exams.  Stay current with your vaccines.  Tell your health care provider if: ? You often feel depressed. ? You have ever been abused or do not feel safe at home. Summary  Adopting a healthy lifestyle and getting preventive care are important in promoting health and  wellness.  Follow your health care provider's instructions about healthy diet, exercising, and getting tested or screened for diseases.  Follow your health care provider's instructions on monitoring your cholesterol and blood pressure. This information is not intended to replace advice given to you by your health care provider. Make sure you discuss any questions you have with your health care provider. Document Revised: 04/23/2018 Document Reviewed: 04/23/2018 Elsevier Patient Education  2020 [employer].

## 2019-12-16 ENCOUNTER — Other Ambulatory Visit: Payer: Self-pay | Admitting: Advanced Practice Midwife

## 2019-12-16 NOTE — Assessment & Plan Note (Signed)
Doing well on amlodipine 10 mg daily and lisinopril/hctz 20/12.5 mg daily. Recent CMP without indication for change.

## 2019-12-16 NOTE — Assessment & Plan Note (Signed)
Flu shot yearly. Covid-19 up to date. Tetanus up to date. Mammogram with gyn, pap smear with gyn. Counseled about sun safety and mole surveillance. Counseled about the dangers of distracted driving. Given 10 year screening recommendations.

## 2019-12-30 NOTE — BH Specialist Note (Signed)
Integrated Behavioral Health via Phone Visit  12/30/2019 Hayley [name] [id number]  Number of Integrated Behavioral Health visits: 1(3 total) Session Start time: 11:06  Session End time: 11:33 Total time: 27 minutes  Referring Provider: Thressa Sheller, CNM Type of Visit: Phone Patient/Family location: Work [facility] Provider location: [facility] [employer] [facility] [employer] [facility]  All persons participating in visit: Patient Hayley [name] and [facility]    Discussed confidentiality: at previous visit  I connected with Hayley [name] by a video enabled telemedicine application (Caregility) and verified that I am speaking with the correct person using two identifiers.    I discussed that engaging in this virtual visit, they consent to the provision of behavioral healthcare and the services will be billed under their insurance.   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 Pt requests refills of Ativan.  Duration of problem: Increasing symptoms; Severity of problem: moderately severe  STRENGTHS (Protective Factors/Coping Skills): Adhering to treatment; utilizing self-coping strategies at home and work  GOALS ADDRESSED: Patient will: 1.  Reduce symptoms of: compulsions and stress  2.  Increase knowledge and/or ability of: stress reduction  3.  Demonstrate ability to: Increase healthy adjustment to current life circumstances  INTERVENTIONS: Interventions utilized:  Solution-Focused Strategies and Medication Monitoring Standardized Assessments completed: Not Needed  ASSESSMENT: Patient  currently experiencing Anxiety disorder.   Patient may benefit from continued psychoeducation and brief therapeutic interventions regarding coping with symptoms of anxiety  .  PLAN: 1. Follow up with behavioral health clinician on : As needed 2. Behavioral recommendations:  -Continue taking BH medication as prescribed -Continue using daily self-coping strategies at home and work for as long as remains helpful  3. Referral(s): Integrated [employer] (In Clinic)  I discussed the assessment and treatment plan with the patient and/or parent/guardian. They were provided an opportunity to ask questions and all were answered. They agreed with the plan and demonstrated an understanding of the instructions.   They were advised to call back or seek an in-person evaluation if the symptoms worsen or if the condition fails to improve as anticipated.   Confirmed patient's address: Yes  Confirmed patient's phone number: Yes  Any changes to demographics: No   Confirmed patient's insurance: Yes  Any changes to patient's insurance: No   I discussed the limitations of evaluation and management by telemedicine and the availability of in person appointments.  I discussed that the purpose of this visit is to provide behavioral health care while limiting exposure to the novel coronavirus.   Discussed there is a possibility of technology failure and discussed alternative modes of communication if that failure occurs.  [name]

## 2019-12-31 ENCOUNTER — Ambulatory Visit (INDEPENDENT_AMBULATORY_CARE_PROVIDER_SITE_OTHER): Payer: BC Managed Care – PPO | Admitting: Clinical

## 2019-12-31 DIAGNOSIS — F419 Anxiety disorder, unspecified: Secondary | ICD-10-CM

## 2019-12-31 NOTE — Patient Instructions (Signed)
[facility] [address] [place], [place] [address] [phone] (main office) [phone] (Login [place]'s office)   

## 2020-01-01 MED ORDER — LORAZEPAM 1 MG PO TABS: 0.5000 mg | ORAL_TABLET | Freq: Three times a day (TID) | ORAL | 0 refills | Status: DC

## 2020-01-20 ENCOUNTER — Other Ambulatory Visit: Payer: Self-pay | Admitting: Internal Medicine

## 2020-02-02 ENCOUNTER — Other Ambulatory Visit: Payer: Self-pay | Admitting: Advanced Practice Midwife

## 2020-02-04 ENCOUNTER — Other Ambulatory Visit: Payer: Self-pay | Admitting: *Deleted

## 2020-02-04 ENCOUNTER — Encounter: Payer: Self-pay | Admitting: *Deleted

## 2020-02-04 MED ORDER — LORAZEPAM 1 MG PO TABS: 0.5000 mg | ORAL_TABLET | Freq: Three times a day (TID) | ORAL | 0 refills | Status: DC

## 2020-02-04 MED ORDER — LORAZEPAM 0.5 MG PO TABS: 0.5000 mg | ORAL_TABLET | Freq: Three times a day (TID) | ORAL | 0 refills | Status: DC

## 2020-02-04 NOTE — Progress Notes (Signed)
Refill for Lorazepam was requested by pt via MyChart. Message returned to pt stating that the refill has been approved and sent to her pharmacy.

## 2020-02-26 ENCOUNTER — Other Ambulatory Visit: Payer: Self-pay | Admitting: Advanced Practice Midwife

## 2020-02-26 MED ORDER — LORAZEPAM 1 MG PO TABS: 0.5000 mg | ORAL_TABLET | Freq: Three times a day (TID) | ORAL | 0 refills | Status: DC

## 2020-03-14 ENCOUNTER — Encounter: Payer: Self-pay | Admitting: Internal Medicine

## 2020-03-14 ENCOUNTER — Other Ambulatory Visit: Payer: Self-pay | Admitting: Family

## 2020-03-14 DIAGNOSIS — L309 Dermatitis, unspecified: Secondary | ICD-10-CM

## 2020-03-23 NOTE — Telephone Encounter (Signed)
I'm going to reply to pt regarding her dermatology referral. Will you follow up with her regarding her request for more cream or something stronger in the meantime.

## 2020-03-24 MED ORDER — BETAMETHASONE VALERATE 0.1 % EX OINT
1.0000 | TOPICAL_OINTMENT | Freq: Two times a day (BID) | CUTANEOUS | 0 refills | Status: DC
Start: 2020-03-24 — End: 2020-07-06

## 2020-03-31 ENCOUNTER — Other Ambulatory Visit: Payer: Self-pay

## 2020-04-05 ENCOUNTER — Other Ambulatory Visit: Payer: Self-pay | Admitting: Advanced Practice Midwife

## 2020-04-05 MED ORDER — LORAZEPAM 1 MG PO TABS: 0.5000 mg | ORAL_TABLET | Freq: Three times a day (TID) | ORAL | 0 refills | Status: DC

## 2020-05-23 ENCOUNTER — Other Ambulatory Visit: Payer: Self-pay

## 2020-06-03 MED ORDER — LORAZEPAM 1 MG PO TABS: 0.5000 mg | ORAL_TABLET | Freq: Three times a day (TID) | ORAL | 0 refills | Status: DC

## 2020-07-06 ENCOUNTER — Other Ambulatory Visit: Payer: Self-pay

## 2020-07-06 ENCOUNTER — Other Ambulatory Visit: Payer: Self-pay | Admitting: Internal Medicine

## 2020-07-06 MED ORDER — BETAMETHASONE VALERATE 0.1 % EX OINT: 1.0000 "application " | TOPICAL_OINTMENT | Freq: Two times a day (BID) | CUTANEOUS | 0 refills | Status: DC

## 2020-07-07 NOTE — Telephone Encounter (Signed)
Called pt.

## 2020-07-07 NOTE — Telephone Encounter (Signed)
Called pt in regards to her MyChart message asking why she was denied for Lexapro refill.  Left message for pt stating that I wanted to speak with her however I will just also respond to her MyChart message if she could please call the office.   Leonette Nutting  07/07/20

## 2020-07-13 ENCOUNTER — Encounter: Payer: Self-pay | Admitting: *Deleted

## 2020-07-25 NOTE — BH Specialist Note (Deleted)
Integrated Behavioral Health via Telemedicine Visit  07/25/2020 DONYEA GAFFORD [id number]  Number of Integrated Behavioral Health visits: 2 Session Start time: 10:15***  Session End time: 11:15*** Total time: {IBH Total DZHG:[id number]}  Referring Provider: Thressa Sheller, CNM Patient/Family location: Home [facility] Provider location: *** All persons participating in visit: *** Types of Service: {CHL AMB TYPE OF SERVICE:[phone]}  I connected with [name] and/or Zelphia Cairo Overfelt's {family members:20773} via  Telephone or Video Enabled Telemedicine Application  (Video is Caregility application) and verified that I am speaking with the correct person using two identifiers. Discussed confidentiality: {YES/NO:21197}  I discussed the limitations of telemedicine and the availability of in person appointments.  Discussed there is a possibility of technology failure and discussed alternative modes of communication if that failure occurs.  I discussed that engaging in this telemedicine visit, they consent to the provision of behavioral healthcare and the services will be billed under their insurance.  Patient and/or legal guardian expressed understanding and consented to Telemedicine visit: {YES/NO:21197}  Presenting Concerns: Patient and/or family reports the following symptoms/concerns: *** Duration of problem: ***; Severity of problem: {Mild/Moderate/Severe:20260}  Patient and/or Family's Strengths/Protective Factors: {CHL AMB BH PROTECTIVE FACTORS:[phone]}  Goals Addressed: Patient will: 1.  Reduce symptoms of: {IBH Symptoms:21014056}  2.  Increase knowledge and/or ability of: {IBH Patient Tools:21014057}  3.  Demonstrate ability to: {IBH Goals:21014053}  Progress towards Goals: {CHL AMB BH PROGRESS TOWARDS GOALS:[phone]}  Interventions: Interventions utilized:  {IBH Interventions:21014054} Standardized Assessments completed: {IBH Screening Tools:21014051}  Patient and/or  Family Response: ***  Assessment: Patient currently experiencing ***.   Patient may benefit from ***.  Plan: 1. Follow up with behavioral health clinician on : *** 2. Behavioral recommendations: *** 3. Referral(s): {IBH Referrals:21014055}  I discussed the assessment and treatment plan with the patient and/or parent/guardian. They were provided an opportunity to ask questions and all were answered. They agreed with the plan and demonstrated an understanding of the instructions.   They were advised to call back or seek an in-person evaluation if the symptoms worsen or if the condition fails to improve as anticipated.  Valetta Close McMannes, LCSW

## 2020-08-01 ENCOUNTER — Ambulatory Visit (INDEPENDENT_AMBULATORY_CARE_PROVIDER_SITE_OTHER): Payer: Self-pay | Admitting: Clinical

## 2020-08-01 DIAGNOSIS — F419 Anxiety disorder, unspecified: Secondary | ICD-10-CM

## 2020-08-01 NOTE — BH Specialist Note (Signed)
Integrated Behavioral Health via Telemedicine Visit  08/01/2020 [name] [id number]  Number of Integrated Behavioral Health visits: 2 Session Start time: 3:54  Session End time: 4:34 Total time: 40   Referring Provider: Thressa Sheller, CNM Patient/Family location: Home [facility] Provider location: [facility]  All persons participating in visit: Patient Hayley [name] and [facility] [place]   Types of Service: Individual psychotherapy and Video visit  I connected with [name] and/or Zelphia Cairo Cloe's n/a via  Telephone or Video Enabled Telemedicine Application  (Video is Caregility application) and verified that I am speaking with the correct person using two identifiers. Discussed confidentiality: Yes   I discussed the limitations of telemedicine and the availability of in person appointments.  Discussed there is a possibility of technology failure and discussed alternative modes of communication if that failure occurs.  I discussed that engaging in this telemedicine visit, they consent to the provision of behavioral healthcare and the services will be billed under their insurance.  Patient and/or legal guardian expressed understanding and consented to Telemedicine visit: Yes   Presenting Concerns: Patient and/or family reports the following symptoms/concerns: Pt states her primary concern today is ongoing anxiety/panic, attributed to current life stressors (stressful workplace, loss of beloved brother-in-law/estate issues, mother's early dementia); helps to talk though feelings today and is requesting refill Ativan.  Duration of problem: Increase in past year; Severity of problem: moderately severe  Patient and/or Family's Strengths/Protective Factors: Social connections, Social and Emotional competence, Concrete supports in place (healthy food, safe environments, etc.), Sense of purpose and Physical Health (exercise, healthy diet,  medication compliance, etc.)  Goals Addressed: Patient will: 1.  Reduce symptoms of: anxiety and stress  2.  Increase knowledge and/or ability of: stress reduction  3.  Demonstrate ability to: Increase healthy adjustment to current life circumstances and Increase adequate support systems for patient/family  Progress towards Goals: Ongoing  Interventions: Interventions utilized:  Solution-Focused Strategies, Medication Monitoring and Link to [facility] Standardized Assessments completed: Not Needed  Patient and/or Family Response: Pt agrees to treatment plan  Assessment: Patient currently experiencing Anxiety disorder.   Patient may benefit from continued psychoeducation and brief therapeutic interventions regarding coping with symptoms of anxiety and life stress .  Plan: 1. Follow up with behavioral health clinician on : Call Sonal Dorwart at [phone] as needed 2. Behavioral recommendations:  -Continue taking Ativan as prescribed -Continue using self-coping strategies to manage emotions -Consider additional grief and caregiver support, as needed (listed on After Visit Summary) 3. Referral(s): Integrated [employer] (In Clinic) and [employer]:  grief and caregiver  I discussed the assessment and treatment plan with the patient and/or parent/guardian. They were provided an opportunity to ask questions and all were answered. They agreed with the plan and demonstrated an understanding of the instructions.   They were advised to call back or seek an in-person evaluation if the symptoms worsen or if the condition fails to improve as anticipated.  Valetta Close Suri Tafolla, LCSW

## 2020-08-01 NOTE — Patient Instructions (Signed)
Center for [facility] at [facility] [address] [place], [place] [address] [phone] (main office) [phone] (Bernardo Brayman's office)         [facility] (Individual and group grief support)   [facility]Gerre Scull  [phone]  [facility] Caregiver Support [email or web address]

## 2020-08-09 ENCOUNTER — Other Ambulatory Visit: Payer: Self-pay | Admitting: Advanced Practice Midwife

## 2020-08-09 MED ORDER — LORAZEPAM 1 MG PO TABS
0.5000 mg | ORAL_TABLET | Freq: Three times a day (TID) | ORAL | 0 refills | Status: DC
Start: 2020-08-09 — End: 2022-03-22

## 2020-09-14 ENCOUNTER — Telehealth: Payer: Self-pay | Admitting: Family Medicine

## 2020-09-14 ENCOUNTER — Other Ambulatory Visit: Payer: Self-pay

## 2020-09-14 NOTE — Telephone Encounter (Signed)
Pt called to get rx refill, but states she goes to a new OBGYN not [facility] and I advised that she would need to call her OBGYN--[facility] ( not [facility]--but  [facility])Pt states she saw [place] in the past but has since been going to [facility]. Pt states she will call her new OB dr

## 2020-09-20 ENCOUNTER — Encounter: Payer: Self-pay | Admitting: Internal Medicine

## 2020-09-20 ENCOUNTER — Telehealth (INDEPENDENT_AMBULATORY_CARE_PROVIDER_SITE_OTHER): Payer: BC Managed Care – PPO | Admitting: Internal Medicine

## 2020-09-20 DIAGNOSIS — U071 COVID-19: Secondary | ICD-10-CM | POA: Diagnosis not present

## 2020-09-20 MED ORDER — PROMETHAZINE-DM 6.25-15 MG/5ML PO SYRP: 5.0000 mL | ORAL_SOLUTION | Freq: Four times a day (QID) | ORAL | 0 refills | Status: DC | PRN

## 2020-09-20 NOTE — Assessment & Plan Note (Signed)
Positive home test. Advised of quarantine recommendations from [facility]. Rx promethazine/dm cough syrup. Given 3 shots and overall mild and improving symptoms will not prescribe oral anti-viral. She will call back in the next 1-2 days if symptoms worsen she would be able to start until Thursday if needed.

## 2020-09-20 NOTE — Progress Notes (Signed)
Virtual Visit via Audio Note  I connected with Hayley [name] on 09/20/20 at 11:00 AM EDT by an audio-only enabled telemedicine application and verified that I am speaking with the correct person using two identifiers.  The patient and the provider were at separate locations throughout the entire encounter. Patient location: home, Provider location: work   I discussed the limitations of evaluation and management by telemedicine and the availability of in person appointments. The patient expressed understanding and agreed to proceed. The patient and the provider were the only parties present for the visit unless noted in HPI below.  History of Present Illness: The patient is a 40 y.o. female with visit for covid-19+. Started Sunday with symptoms. Has sore throat and cough and congestion and fevers. Home rapid positive and went to get tested as her work policy for leave requires a PCR test. 1 child with symptoms. Several other staff members at work out with covid-19. Denies SOB. Overall it is worsening but not severe symptoms. Has tried some otc medications. She has 3 shots for covid-19.   Observations/Objective: A and O times 3, no coughing during visit, no dyspnea during visit.   Assessment and Plan: See problem oriented charting  Follow Up Instructions: Rx promethazine/dm for cough and monitor symptoms if worsening in next 1-2 days call back and we can prescribe oral anti-covid-19 therapy  Visit time 11 minutes in non-face to face communication with patient and coordination of care.   I discussed the assessment and treatment plan with the patient. The patient was provided an opportunity to ask questions and all were answered. The patient agreed with the plan and demonstrated an understanding of the instructions.   The patient was advised to call back or seek an in-person evaluation if the symptoms worsen or if the condition fails to improve as anticipated.  Myrlene Broker, MD

## 2020-09-21 ENCOUNTER — Telehealth: Payer: Self-pay | Admitting: Internal Medicine

## 2020-12-15 ENCOUNTER — Other Ambulatory Visit: Payer: Self-pay | Admitting: Internal Medicine

## 2021-01-17 ENCOUNTER — Encounter: Payer: Self-pay | Admitting: Internal Medicine

## 2021-01-19 ENCOUNTER — Other Ambulatory Visit: Payer: Self-pay

## 2021-01-19 MED ORDER — AMLODIPINE BESYLATE 10 MG PO TABS: 10.0000 mg | ORAL_TABLET | Freq: Every day | ORAL | 0 refills | Status: DC

## 2021-01-23 ENCOUNTER — Ambulatory Visit: Payer: BC Managed Care – PPO | Admitting: Internal Medicine

## 2021-01-25 ENCOUNTER — Other Ambulatory Visit: Payer: Self-pay | Admitting: Internal Medicine

## 2021-02-13 ENCOUNTER — Other Ambulatory Visit: Payer: Self-pay | Admitting: Internal Medicine

## 2021-02-14 ENCOUNTER — Other Ambulatory Visit: Payer: Self-pay

## 2021-02-14 ENCOUNTER — Encounter: Payer: Self-pay | Admitting: Internal Medicine

## 2021-02-14 ENCOUNTER — Ambulatory Visit: Payer: BC Managed Care – PPO | Admitting: Internal Medicine

## 2021-02-14 VITALS — BP 124/90 | HR 73 | Temp 98.3°F | Resp 18 | Ht 60.0 in | Wt 158.4 lb

## 2021-02-14 DIAGNOSIS — Z23 Encounter for immunization: Secondary | ICD-10-CM | POA: Diagnosis not present

## 2021-02-14 DIAGNOSIS — I10 Essential (primary) hypertension: Secondary | ICD-10-CM | POA: Diagnosis not present

## 2021-02-14 DIAGNOSIS — Z0000 Encounter for general adult medical examination without abnormal findings: Secondary | ICD-10-CM

## 2021-02-14 MED ORDER — AMLODIPINE BESYLATE 10 MG PO TABS: 10.0000 mg | ORAL_TABLET | Freq: Every day | ORAL | 3 refills | Status: DC

## 2021-02-14 MED ORDER — LISINOPRIL-HYDROCHLOROTHIAZIDE 10-12.5 MG PO TABS
1.0000 | ORAL_TABLET | Freq: Every day | ORAL | 3 refills | Status: DC
Start: 2021-02-14 — End: 2022-04-30

## 2021-02-14 NOTE — Progress Notes (Signed)
   Subjective:   Patient ID: Hayley [name], female    DOB: [birth year], 40 y.o.   MRN: [id number]  HPI The patient is a 40 YO female coming in for physical.   PMH, FMH, social history reviewed and updated  Review of Systems  Constitutional: Negative.   HENT: Negative.    Eyes: Negative.   Respiratory:  Negative for chest tightness and shortness of breath.   Cardiovascular:  Negative for palpitations and leg swelling.  Gastrointestinal:  Negative for abdominal distention, constipation and diarrhea.  Musculoskeletal: Negative.   Skin: Negative.   Neurological: Negative.   Psychiatric/Behavioral: Negative.     Objective:  Physical Exam Constitutional:      Appearance: She is well-developed.  HENT:     Head: Normocephalic and atraumatic.  Cardiovascular:     Rate and Rhythm: Normal rate and regular rhythm.  Pulmonary:     Effort: Pulmonary effort is normal. No respiratory distress.     Breath sounds: Normal breath sounds. No wheezing or rales.  Abdominal:     General: Bowel sounds are normal. There is no distension.     Palpations: Abdomen is soft.     Tenderness: There is no abdominal tenderness. There is no rebound.  Musculoskeletal:     Cervical back: Normal range of motion.  Skin:    General: Skin is warm and dry.  Neurological:     Mental Status: She is alert and oriented to person, place, and time.     Coordination: Coordination normal.    Vitals:   02/14/21 0848  BP: 124/90  Pulse: 73  Resp: 18  Temp: 98.3 F (36.8 C)  TempSrc: Oral  SpO2: 98%  Weight: 158 lb 6.4 oz (71.8 kg)  Height: 5' (1.524 m)    This visit occurred during the SARS-CoV-2 public health emergency.  Safety protocols were in place, including screening questions prior to the visit, additional usage of staff PPE, and extensive cleaning of exam room while observing appropriate contact time as indicated for disinfecting solutions.   Assessment & Plan:  Flu shot given at visit

## 2021-02-14 NOTE — Patient Instructions (Signed)
We have given you the flu shot. Get the updated covid-19 booster shot.

## 2021-02-17 NOTE — Assessment & Plan Note (Signed)
Flu shot yearly. Covid-19 booster encouraged. Tetanus up to date. Mammogram with gyn, pap smear with gyn. Counseled about sun safety and mole surveillance. Counseled about the dangers of distracted driving. Given 10 year screening recommendations.

## 2021-02-17 NOTE — Assessment & Plan Note (Signed)
Checking labs for monitoring and refill lisinopril/hctz 10/12.5 mg daily which she has been taking for about 1 month without change in BP readings. Will remove alternate dosing from her medication list so this is not inadvertently prescribed. Also continue and refill amlodipine 10 mg daily.

## 2021-03-08 ENCOUNTER — Other Ambulatory Visit: Payer: Self-pay | Admitting: Physician Assistant

## 2021-03-08 ENCOUNTER — Telehealth: Payer: BC Managed Care – PPO | Admitting: Physician Assistant

## 2021-03-08 DIAGNOSIS — J209 Acute bronchitis, unspecified: Secondary | ICD-10-CM

## 2021-03-08 MED ORDER — AZITHROMYCIN 250 MG PO TABS
ORAL_TABLET | ORAL | 0 refills | Status: DC
Start: 2021-03-08 — End: 2021-03-08

## 2021-03-08 MED ORDER — BENZONATATE 100 MG PO CAPS: 100.0000 mg | ORAL_CAPSULE | Freq: Three times a day (TID) | ORAL | 0 refills | Status: DC | PRN

## 2021-03-08 MED ORDER — BENZONATATE 100 MG PO CAPS
100.0000 mg | ORAL_CAPSULE | Freq: Three times a day (TID) | ORAL | 0 refills | Status: DC | PRN
Start: 2021-03-08 — End: 2021-03-08

## 2021-03-08 MED ORDER — AZITHROMYCIN 250 MG PO TABS: ORAL_TABLET | ORAL | 0 refills | Status: DC

## 2021-03-08 MED ORDER — ALBUTEROL SULFATE HFA 108 (90 BASE) MCG/ACT IN AERS
2.0000 | INHALATION_SPRAY | Freq: Four times a day (QID) | RESPIRATORY_TRACT | 0 refills | Status: DC | PRN
Start: 2021-03-08 — End: 2021-03-09

## 2021-03-08 MED ORDER — AZITHROMYCIN 250 MG PO TABS: ORAL_TABLET | ORAL | 0 refills | Status: AC

## 2021-03-08 NOTE — Patient Instructions (Signed)
Hayley [name], thank you for joining [name], PA-C for today's virtual visit.  While this provider is not your primary care provider (PCP), if your PCP is located in our provider database this encounter information will be shared with them immediately following your visit.  Consent: (Patient) Hayley [name] provided verbal consent for this virtual visit at the beginning of the encounter.  Current Medications:  Current Outpatient Medications:    amLODipine (NORVASC) 10 MG tablet, Take 1 tablet (10 mg total) by mouth daily., Disp: 90 tablet, Rfl: 3   betamethasone valerate ointment (VALISONE) 0.1 %, Apply 1 application topically 2 (two) times daily., Disp: 30 g, Rfl: 0   lisinopril-hydrochlorothiazide (ZESTORETIC) 10-12.5 MG tablet, Take 1 tablet by mouth daily., Disp: 90 tablet, Rfl: 3   LORazepam (ATIVAN) 1 MG tablet, Take 0.5-1 tablets (0.5-1 mg total) by mouth every 8 (eight) hours., Disp: 30 tablet, Rfl: 0   meclizine (ANTIVERT) 25 MG tablet, Take 1 tablet (25 mg total) by mouth 3 (three) times daily as needed for dizziness., Disp: 30 tablet, Rfl: 0   Multiple Vitamins-Minerals (RA ONE DAILY GUMMY VITES PO), Take 2 tablets daily by mouth., Disp: , Rfl:    sertraline (ZOLOFT) 100 MG tablet, Take 1 tablet (100 mg total) by mouth daily., Disp: 30 tablet, Rfl: 11   Medications ordered in this encounter:  No orders of the defined types were placed in this encounter.    *If you need refills on other medications prior to your next appointment, please contact your pharmacy*  Follow-Up: Call back or seek an in-person evaluation if the symptoms worsen or if the condition fails to improve as anticipated.  Other Instructions   Increase fluids.  Get plenty of rest. Use Mucinex for congestion. Use the Tessalon and Albuterol as directed, when needed. . Take a daily probiotic (I recommend Align or Culturelle, but even Activia Yogurt may be beneficial).  A humidifier placed in the bedroom  may offer some relief for a dry, scratchy throat of nasal irritation.  Read information below on acute bronchitis.   If you note recurrence of fever or change in sputum (productive, colored, etc) start the antibiotic I placed on file at the pharmacy for you.   Acute Bronchitis Bronchitis is when the airways that extend from the windpipe into the lungs get red, puffy, and painful (inflamed). Bronchitis often causes thick spit (mucus) to develop. This leads to a cough. A cough is the most common symptom of bronchitis. In acute bronchitis, the condition usually begins suddenly and goes away over time (usually in 2 weeks). Smoking, allergies, and asthma can make bronchitis worse. Repeated episodes of bronchitis may cause more lung problems.  HOME CARE Rest. Drink enough fluids to keep your pee (urine) clear or pale yellow (unless you need to limit fluids as told by your doctor). Only take over-the-counter or prescription medicines as told by your doctor. Avoid smoking and secondhand smoke. These can make bronchitis worse. If you are a smoker, think about using nicotine gum or skin patches. Quitting smoking will help your lungs heal faster. Reduce the chance of getting bronchitis again by: Washing your hands often. Avoiding people with cold symptoms. Trying not to touch your hands to your mouth, nose, or eyes. Follow up with your doctor as told.  GET HELP IF: Your symptoms do not improve after 1 week of treatment. Symptoms include: Cough. Fever. Coughing up thick spit. Body aches. Chest congestion. Chills. Shortness of breath. Sore throat.  GET HELP RIGHT AWAY IF:  You have an increased fever. You have chills. You have severe shortness of breath. You have bloody thick spit (sputum). You throw up (vomit) often. You lose too much body fluid (dehydration). You have a severe headache. You faint.  MAKE SURE YOU:  Understand these instructions. Will watch your condition. Will get help  right away if you are not doing well or get worse. Document Released: 10/17/2007 Document Revised: 12/31/2012 Document Reviewed: 10/21/2012 [facility] Patient Information 2015 [place], [place]. This information is not intended to replace advice given to you by your health care provider. Make sure you discuss any questions you have with your health care provider.    If you have been instructed to have an in-person evaluation today at a local Urgent Care facility, please use the link below. It will take you to a list of all of our available [facility], including address, phone number and hours of operation. Please do not delay care.  [facility]  If you or a family member do not have a primary care provider, use the link below to schedule a visit and establish care. When you choose a [facility] primary care physician or advanced practice provider, you gain a long-term partner in health. Find a Primary Care Provider  Learn more about [facility]'s in-office and virtual care options: [facility] - Get Care Now

## 2021-03-08 NOTE — Progress Notes (Signed)
Virtual Visit Consent   Hayley [name], you are scheduled for a virtual visit with a [facility] provider today.     Just as with appointments in the office, your consent must be obtained to participate.  Your consent will be active for this visit and any virtual visit you may have with one of our providers in the next 365 days.     If you have a MyChart account, a copy of this consent can be sent to you electronically.  All virtual visits are billed to your insurance company just like a traditional visit in the office.    As this is a virtual visit, video technology does not allow for your provider to perform a traditional examination.  This may limit your provider's ability to fully assess your condition.  If your provider identifies any concerns that need to be evaluated in person or the need to arrange testing (such as labs, EKG, etc.), we will make arrangements to do so.     Although advances in technology are sophisticated, we cannot ensure that it will always work on either your end or our end.  If the connection with a video visit is poor, the visit may have to be switched to a telephone visit.  With either a video or telephone visit, we are not always able to ensure that we have a secure connection.     I need to obtain your verbal consent now.   Are you willing to proceed with your visit today?    Hayley [name] has provided verbal consent on 03/08/2021 for a virtual visit (video or telephone).   [name], [place]   Date: 03/08/2021 9:27 AM   Virtual Visit via Video Note   I, [name], connected with  Hayley [name]  ([id number], 1981/04/07) on 03/08/21 at  9:15 AM EDT by a video-enabled telemedicine application and verified that I am speaking with the correct person using two identifiers.  Location: Patient: Virtual Visit Location Patient: Home Provider: Virtual Visit Location Provider: Home Office   I discussed the limitations of evaluation and management by  telemedicine and the availability of in person appointments. The patient expressed understanding and agreed to proceed.    History of Present Illness: Hayley [name] is a 40 y.o. who identifies as a female who was assigned female at birth, and is being seen today for URI symptoms starting last week. Notes initially with bilateral scratchy throat and cough progressively worsening. Cough is dry and wheezy but quite significant. Denies SOB outside of coughing spells. Denies sinus pain, ear pain or tooth pain. Works in the school system so multiple sick contacts. No known flu exposure. Has had a negative COVID test x 2+ -- all which were negative.   HPI: HPI  Problems:  Patient Active Problem List   Diagnosis Date Noted   Routine general medical examination at a health care facility 03/04/2018   Essential hypertension 04/16/2017    Allergies:  Allergies  Allergen Reactions   Percocet [Oxycodone-Acetaminophen] Nausea Only   Prednisone Other (See Comments)    Possible blood pressure spike   Medications:  Current Outpatient Medications:    albuterol (VENTOLIN HFA) 108 (90 Base) MCG/ACT inhaler, Inhale 2 puffs into the lungs every 6 (six) hours as needed for wheezing or shortness of breath., Disp: 8 g, Rfl: 0   azithromycin (ZITHROMAX) 250 MG tablet, Take 2 tablets on day 1, then 1 tablet daily on days 2 through 5, Disp:  6 tablet, Rfl: 0   benzonatate (TESSALON) 100 MG capsule, Take 1 capsule (100 mg total) by mouth 3 (three) times daily as needed for cough., Disp: 30 capsule, Rfl: 0   amLODipine (NORVASC) 10 MG tablet, Take 1 tablet (10 mg total) by mouth daily., Disp: 90 tablet, Rfl: 3   betamethasone valerate ointment (VALISONE) 0.1 %, Apply 1 application topically 2 (two) times daily., Disp: 30 g, Rfl: 0   lisinopril-hydrochlorothiazide (ZESTORETIC) 10-12.5 MG tablet, Take 1 tablet by mouth daily., Disp: 90 tablet, Rfl: 3   LORazepam (ATIVAN) 1 MG tablet, Take 0.5-1 tablets (0.5-1 mg total) by  mouth every 8 (eight) hours., Disp: 30 tablet, Rfl: 0   meclizine (ANTIVERT) 25 MG tablet, Take 1 tablet (25 mg total) by mouth 3 (three) times daily as needed for dizziness., Disp: 30 tablet, Rfl: 0   Multiple Vitamins-Minerals (RA ONE DAILY GUMMY VITES PO), Take 2 tablets daily by mouth., Disp: , Rfl:    sertraline (ZOLOFT) 100 MG tablet, Take 1 tablet (100 mg total) by mouth daily., Disp: 30 tablet, Rfl: 11  Observations/Objective: Patient is well-developed, well-nourished in no acute distress.  Resting comfortably at home.  Head is normocephalic, atraumatic.  No labored breathing. Speech is clear and coherent with logical content.  Patient is alert and oriented at baseline.   Assessment and Plan: 1. Acute bronchitis, unspecified organism - benzonatate (TESSALON) 100 MG capsule; Take 1 capsule (100 mg total) by mouth 3 (three) times daily as needed for cough.  Dispense: 30 capsule; Refill: 0 - albuterol (VENTOLIN HFA) 108 (90 Base) MCG/ACT inhaler; Inhale 2 puffs into the lungs every 6 (six) hours as needed for wheezing or shortness of breath.  Dispense: 8 g; Refill: 0 - azithromycin (ZITHROMAX) 250 MG tablet; Take 2 tablets on day 1, then 1 tablet daily on days 2 through 5  Dispense: 6 tablet; Refill: 0 Most likely viral with bronchospasm giving constellation of symptoms and lack of fever and productive cough. Start with supportive measures -- reviewed with patient. OTc medications reviewed. Rx Tessalon for cough and albuterol for bronchospasm. Giving her concerns, will send in Azithromycin to be placed on file at pharmacy. She is to start if symptoms not easing up in next 48-72 hours or if she notes recurrence of fever and/or productive cough.  Follow Up Instructions: I discussed the assessment and treatment plan with the patient. The patient was provided an opportunity to ask questions and all were answered. The patient agreed with the plan and demonstrated an understanding of the  instructions.  A copy of instructions were sent to the patient via MyChart unless otherwise noted below.   The patient was advised to call back or seek an in-person evaluation if the symptoms worsen or if the condition fails to improve as anticipated.  Time:  I spent 12 minutes with the patient via telehealth technology discussing the above problems/concerns.    [name], PA-C

## 2021-03-09 ENCOUNTER — Telehealth: Payer: Self-pay

## 2021-03-09 ENCOUNTER — Encounter: Payer: Self-pay | Admitting: Internal Medicine

## 2021-03-09 ENCOUNTER — Other Ambulatory Visit: Payer: Self-pay | Admitting: Physician Assistant

## 2021-03-09 ENCOUNTER — Encounter: Payer: Self-pay | Admitting: Physician Assistant

## 2021-03-09 DIAGNOSIS — J209 Acute bronchitis, unspecified: Secondary | ICD-10-CM

## 2021-03-09 MED ORDER — ALBUTEROL SULFATE HFA 108 (90 BASE) MCG/ACT IN AERS: 2.0000 | INHALATION_SPRAY | Freq: Four times a day (QID) | RESPIRATORY_TRACT | 0 refills | Status: DC | PRN

## 2021-03-09 MED ORDER — PROMETHAZINE-DM 6.25-15 MG/5ML PO SYRP: 5.0000 mL | ORAL_SOLUTION | Freq: Four times a day (QID) | ORAL | 0 refills | Status: DC | PRN

## 2021-03-09 NOTE — Telephone Encounter (Signed)
Patient was prescribed Tessaalon yesterday however the cough has not loosened up. Requesting a different medication.

## 2021-03-09 NOTE — Telephone Encounter (Signed)
See my chart message

## 2022-03-11 ENCOUNTER — Other Ambulatory Visit: Payer: Self-pay | Admitting: Internal Medicine

## 2022-03-12 ENCOUNTER — Other Ambulatory Visit: Payer: Self-pay | Admitting: Internal Medicine

## 2022-03-14 ENCOUNTER — Other Ambulatory Visit: Payer: Self-pay

## 2022-03-14 ENCOUNTER — Telehealth: Payer: Self-pay | Admitting: Internal Medicine

## 2022-03-14 DIAGNOSIS — I10 Essential (primary) hypertension: Secondary | ICD-10-CM

## 2022-03-14 MED ORDER — AMLODIPINE BESYLATE 10 MG PO TABS
10.0000 mg | ORAL_TABLET | Freq: Every day | ORAL | 0 refills | Status: DC
Start: 2022-03-14 — End: 2022-04-09

## 2022-03-14 NOTE — Telephone Encounter (Signed)
Patient needs her amlodopine refilled - she only has two left and she wanted to know if enough could be sent in until next Thursday at her appointment.,  Please send to [facility] on [address], [place], [place].   Next Appt:  03/22/2022

## 2022-03-22 ENCOUNTER — Encounter: Payer: Self-pay | Admitting: Internal Medicine

## 2022-03-22 ENCOUNTER — Ambulatory Visit: Payer: BC Managed Care – PPO | Admitting: Internal Medicine

## 2022-03-22 VITALS — BP 120/80 | HR 76 | Temp 98.4°F | Ht 61.0 in | Wt 163.0 lb

## 2022-03-22 DIAGNOSIS — Z23 Encounter for immunization: Secondary | ICD-10-CM

## 2022-03-22 DIAGNOSIS — Z0000 Encounter for general adult medical examination without abnormal findings: Secondary | ICD-10-CM

## 2022-03-22 DIAGNOSIS — I10 Essential (primary) hypertension: Secondary | ICD-10-CM

## 2022-03-22 LAB — COMPREHENSIVE METABOLIC PANEL
ALT: 23 U/L (ref 0–35)
AST: 22 U/L (ref 0–37)
Albumin: 4.8 g/dL (ref 3.5–5.2)
Alkaline Phosphatase: 56 U/L (ref 39–117)
BUN: 20 mg/dL (ref 6–23)
CO2: 27 mEq/L (ref 19–32)
Calcium: 9.9 mg/dL (ref 8.4–10.5)
Chloride: 102 mEq/L (ref 96–112)
Creatinine, Ser: 0.74 mg/dL (ref 0.40–1.20)
GFR: 99.14 mL/min (ref >60.00)
Glucose, Bld: 70 mg/dL (ref 70–99)
Potassium: 3.8 mEq/L (ref 3.5–5.1)
Sodium: 137 mEq/L (ref 135–145)
Total Bilirubin: 0.4 mg/dL (ref 0.2–1.2)
Total Protein: 8 g/dL (ref 6.0–8.3)

## 2022-03-22 LAB — CBC
HCT: 39.7 % (ref 36.0–46.0)
Hemoglobin: 13.8 g/dL (ref 12.0–15.0)
MCHC: 34.7 g/dL (ref 30.0–36.0)
MCV: 96.7 fl (ref 78.0–100.0)
Platelets: 371 10*3/uL (ref 150.0–400.0)
RBC: 4.11 Mil/uL (ref 3.87–5.11)
RDW: 13 % (ref 11.5–15.5)
WBC: 7.8 10*3/uL (ref 4.0–10.5)

## 2022-03-22 LAB — LIPID PANEL
Cholesterol: 261 mg/dL — ABNORMAL HIGH (ref 0–200)
HDL: 64.6 mg/dL (ref >39.00)
LDL Cholesterol: 174 mg/dL — ABNORMAL HIGH (ref 0–99)
NonHDL: 196.56
Total CHOL/HDL Ratio: 4
Triglycerides: 112 mg/dL (ref 0.0–149.0)
VLDL: 22.4 mg/dL (ref 0.0–40.0)

## 2022-03-22 NOTE — Progress Notes (Signed)
   Subjective:   Patient ID: Hayley [name], female    DOB: 15-Sep-1980, 41 y.o.   MRN: [id number]  HPI The patient is here for physical.  PMH, [facility], social history reviewed and updated  Review of Systems  Constitutional: Negative.   HENT: Negative.    Eyes: Negative.   Respiratory:  Negative for cough, chest tightness and shortness of breath.   Cardiovascular:  Negative for chest pain, palpitations and leg swelling.  Gastrointestinal:  Negative for abdominal distention, abdominal pain, constipation, diarrhea, nausea and vomiting.  Musculoskeletal: Negative.   Skin: Negative.   Neurological: Negative.   Psychiatric/Behavioral: Negative.      Objective:  Physical Exam Constitutional:      Appearance: She is well-developed.  HENT:     Head: Normocephalic and atraumatic.  Cardiovascular:     Rate and Rhythm: Normal rate and regular rhythm.  Pulmonary:     Effort: Pulmonary effort is normal. No respiratory distress.     Breath sounds: Normal breath sounds. No wheezing or rales.  Abdominal:     General: Bowel sounds are normal. There is no distension.     Palpations: Abdomen is soft.     Tenderness: There is no abdominal tenderness. There is no rebound.  Musculoskeletal:     Cervical back: Normal range of motion.  Skin:    General: Skin is warm and dry.  Neurological:     Mental Status: She is alert and oriented to person, place, and time.     Coordination: Coordination normal.     Vitals:   03/22/22 0822  BP: 120/80  Pulse: 76  Temp: 98.4 F (36.9 C)  TempSrc: Oral  SpO2: 99%  Weight: 163 lb (73.9 kg)  Height: 5\' 1"  (1.549 m)    Assessment & Plan:  Flu shot given at visit

## 2022-03-22 NOTE — Assessment & Plan Note (Signed)
BP at goal on amlodipine 10 mg daily and lisinopril/hctz 10/12.5 mg daily. Checking CMP CBC and lipid panel and adjust as needed.

## 2022-03-22 NOTE — Assessment & Plan Note (Signed)
Flu shot given. Covid-19 counseled. Tetanus up to date. Pap smear up to date. Counseled about sun safety and mole surveillance. Counseled about the dangers of distracted driving. Given 10 year screening recommendations.   

## 2022-04-07 ENCOUNTER — Other Ambulatory Visit: Payer: Self-pay | Admitting: Internal Medicine

## 2022-04-07 DIAGNOSIS — I10 Essential (primary) hypertension: Secondary | ICD-10-CM

## 2022-04-19 ENCOUNTER — Telehealth: Payer: BC Managed Care – PPO | Admitting: Physician Assistant

## 2022-04-19 DIAGNOSIS — J019 Acute sinusitis, unspecified: Secondary | ICD-10-CM

## 2022-04-19 DIAGNOSIS — B9689 Other specified bacterial agents as the cause of diseases classified elsewhere: Secondary | ICD-10-CM

## 2022-04-19 MED ORDER — FLUTICASONE PROPIONATE 50 MCG/ACT NA SUSP: 2.0000 | Freq: Every day | NASAL | 0 refills | Status: DC

## 2022-04-19 MED ORDER — AMOXICILLIN-POT CLAVULANATE 875-125 MG PO TABS: 1.0000 | ORAL_TABLET | Freq: Two times a day (BID) | ORAL | 0 refills | Status: DC

## 2022-04-19 NOTE — Patient Instructions (Signed)
Hayley [name], thank you for joining [name], PA-C for today's virtual visit.  While this provider is not your primary care provider (PCP), if your PCP is located in our provider database this encounter information will be shared with them immediately following your visit.   A [facility] MyChart account gives you access to today's visit and all your visits, tests, and labs performed at [facility] " click here if you don't have a [facility] MyChart account or go to [email or web address]  Consent: (Patient) Hayley [name] provided verbal consent for this virtual visit at the beginning of the encounter.  Current Medications:  Current Outpatient Medications:    amLODipine (NORVASC) 10 MG tablet, TAKE 1 TABLET BY MOUTH EVERY DAY, Disp: 30 tablet, Rfl: 0   lisinopril-hydrochlorothiazide (ZESTORETIC) 10-12.5 MG tablet, Take 1 tablet by mouth daily., Disp: 90 tablet, Rfl: 3   Medications ordered in this encounter:  No orders of the defined types were placed in this encounter.    *If you need refills on other medications prior to your next appointment, please contact your pharmacy*  Follow-Up: Call back or seek an in-person evaluation if the symptoms worsen or if the condition fails to improve as anticipated.  [facility] [phone]  Other Instructions Please take antibiotic as directed.  Increase fluid intake.  Use Saline nasal spray.  Take a daily multivitamin. Flonase as directed. You can use OTC Coricidin HBP for symptom relief as well.  Place a humidifier in the bedroom.  Please call or return clinic if symptoms are not improving.  Sinusitis Sinusitis is redness, soreness, and swelling (inflammation) of the paranasal sinuses. Paranasal sinuses are air pockets within the bones of your face (beneath the eyes, the middle of the forehead, or above the eyes). In healthy paranasal sinuses, mucus is able to drain out, and air is able to circulate  through them by way of your nose. However, when your paranasal sinuses are inflamed, mucus and air can become trapped. This can allow bacteria and other germs to grow and cause infection. Sinusitis can develop quickly and last only a short time (acute) or continue over a long period (chronic). Sinusitis that lasts for more than 12 weeks is considered chronic.  CAUSES  Causes of sinusitis include: Allergies. Structural abnormalities, such as displacement of the cartilage that separates your nostrils (deviated septum), which can decrease the air flow through your nose and sinuses and affect sinus drainage. Functional abnormalities, such as when the small hairs (cilia) that line your sinuses and help remove mucus do not work properly or are not present. SYMPTOMS  Symptoms of acute and chronic sinusitis are the same. The primary symptoms are pain and pressure around the affected sinuses. Other symptoms include: Upper toothache. Earache. Headache. Bad breath. Decreased sense of smell and taste. A cough, which worsens when you are lying flat. Fatigue. Fever. Thick drainage from your nose, which often is green and may contain pus (purulent). Swelling and warmth over the affected sinuses. DIAGNOSIS  Your caregiver will perform a physical exam. During the exam, your caregiver may: Look in your nose for signs of abnormal growths in your nostrils (nasal polyps). Tap over the affected sinus to check for signs of infection. View the inside of your sinuses (endoscopy) with a special imaging device with a light attached (endoscope), which is inserted into your sinuses. If your caregiver suspects that you have chronic sinusitis, one or more of the following tests may be recommended: Allergy  tests. Nasal culture A sample of mucus is taken from your nose and sent to a lab and screened for bacteria. Nasal cytology A sample of mucus is taken from your nose and examined by your caregiver to determine if your  sinusitis is related to an allergy. TREATMENT  Most cases of acute sinusitis are related to a viral infection and will resolve on their own within 10 days. Sometimes medicines are prescribed to help relieve symptoms (pain medicine, decongestants, nasal steroid sprays, or saline sprays).  However, for sinusitis related to a bacterial infection, your caregiver will prescribe antibiotic medicines. These are medicines that will help kill the bacteria causing the infection.  Rarely, sinusitis is caused by a fungal infection. In theses cases, your caregiver will prescribe antifungal medicine. For some cases of chronic sinusitis, surgery is needed. Generally, these are cases in which sinusitis recurs more than 3 times per year, despite other treatments. HOME CARE INSTRUCTIONS  Drink plenty of water. Water helps thin the mucus so your sinuses can drain more easily. Use a humidifier. Inhale steam 3 to 4 times a day (for example, sit in the bathroom with the shower running). Apply a warm, moist washcloth to your face 3 to 4 times a day, or as directed by your caregiver. Use saline nasal sprays to help moisten and clean your sinuses. Take over-the-counter or prescription medicines for pain, discomfort, or fever only as directed by your caregiver. SEEK IMMEDIATE MEDICAL CARE IF: You have increasing pain or severe headaches. You have nausea, vomiting, or drowsiness. You have swelling around your face. You have vision problems. You have a stiff neck. You have difficulty breathing. MAKE SURE YOU:  Understand these instructions. Will watch your condition. Will get help right away if you are not doing well or get worse. Document Released: 04/30/2005 Document Revised: 07/23/2011 Document Reviewed: 05/15/2011 [facility] Patient Information 2014 [place], [place].    If you have been instructed to have an in-person evaluation today at a local Urgent Care facility, please use the link below. It will take you to  a list of all of our available [facility], including address, phone number and hours of operation. Please do not delay care.  [facility]  If you or a family member do not have a primary care provider, use the link below to schedule a visit and establish care. When you choose a [facility] primary care physician or advanced practice provider, you gain a long-term partner in health. Find a Primary Care Provider  Learn more about [facility]'s in-office and virtual care options:  - Get Care Now

## 2022-04-19 NOTE — Progress Notes (Signed)
Virtual Visit Consent   Hayley [name], you are scheduled for a virtual visit with a [facility] provider today. Just as with appointments in the office, your consent must be obtained to participate. Your consent will be active for this visit and any virtual visit you may have with one of our providers in the next 365 days. If you have a MyChart account, a copy of this consent can be sent to you electronically.  As this is a virtual visit, video technology does not allow for your provider to perform a traditional examination. This may limit your provider's ability to fully assess your condition. If your provider identifies any concerns that need to be evaluated in person or the need to arrange testing (such as labs, EKG, etc.), we will make arrangements to do so. Although advances in technology are sophisticated, we cannot ensure that it will always work on either your end or our end. If the connection with a video visit is poor, the visit may have to be switched to a telephone visit. With either a video or telephone visit, we are not always able to ensure that we have a secure connection.  By engaging in this virtual visit, you consent to the provision of healthcare and authorize for your insurance to be billed (if applicable) for the services provided during this visit. Depending on your insurance coverage, you may receive a charge related to this service.  I need to obtain your verbal consent now. Are you willing to proceed with your visit today? Hayley [name] has provided verbal consent on 04/19/2022 for a virtual visit (video or telephone). Hayley [name], [place]  Date: 04/19/2022 9:21 AM  Virtual Visit via Video Note   I, Hayley [name], connected with  Hayley [name]  ([id number], 1981/04/16) on 04/19/22 at  9:15 AM EST by a video-enabled telemedicine application and verified that I am speaking with the correct person using two identifiers.  Location: Patient: Virtual Visit Location  Patient: Home Provider: Virtual Visit Location Provider: Home Office   I discussed the limitations of evaluation and management by telemedicine and the availability of in person appointments. The patient expressed understanding and agreed to proceed.    History of Present Illness: Hayley [name] is a 41 y.o. who identifies as a female who was assigned female at birth, and is being seen today for 5 days of nasal congestion, sinus pressure, sinus pain. Now with intermittent, low grade fever. Denies ear pain but noting maxillary facial pain.    HPI: HPI  Problems:  Patient Active Problem List   Diagnosis Date Noted   Routine general medical examination at a health care facility 03/04/2018   Essential hypertension 04/16/2017    Allergies:  Allergies  Allergen Reactions   Percocet [Oxycodone-Acetaminophen] Nausea Only   Prednisone Other (See Comments)    Possible blood pressure spike   Medications:  Current Outpatient Medications:    amoxicillin-clavulanate (AUGMENTIN) 875-125 MG tablet, Take 1 tablet by mouth 2 (two) times daily., Disp: 14 tablet, Rfl: 0   amLODipine (NORVASC) 10 MG tablet, TAKE 1 TABLET BY MOUTH EVERY DAY, Disp: 30 tablet, Rfl: 0   lisinopril-hydrochlorothiazide (ZESTORETIC) 10-12.5 MG tablet, Take 1 tablet by mouth daily., Disp: 90 tablet, Rfl: 3  Observations/Objective: Patient is well-developed, well-nourished in no acute distress.  Resting comfortably at home.  Head is normocephalic, atraumatic.  No labored breathing. Speech is clear and coherent with logical content.  Patient is alert and oriented at baseline.  Assessment and Plan: 1. Acute bacterial sinusitis - amoxicillin-clavulanate (AUGMENTIN) 875-125 MG tablet; Take 1 tablet by mouth 2 (two) times daily.  Dispense: 14 tablet; Refill: 0  Rx Augmentin.  Increase fluids.  Rest.  Saline nasal spray.  Probiotic.  Mucinex as directed.  Humidifier in bedroom. Flonase per orders.  Call or return to clinic if  symptoms are not improving.   Follow Up Instructions: I discussed the assessment and treatment plan with the patient. The patient was provided an opportunity to ask questions and all were answered. The patient agreed with the plan and demonstrated an understanding of the instructions.  A copy of instructions were sent to the patient via MyChart unless otherwise noted below.   The patient was advised to call back or seek an in-person evaluation if the symptoms worsen or if the condition fails to improve as anticipated.  Time:  I spent 10 minutes with the patient via telehealth technology discussing the above problems/concerns.    Hayley Climes, PA-C

## 2022-04-28 ENCOUNTER — Other Ambulatory Visit: Payer: Self-pay | Admitting: Internal Medicine

## 2022-04-30 ENCOUNTER — Other Ambulatory Visit: Payer: Self-pay | Admitting: Internal Medicine

## 2022-04-30 DIAGNOSIS — I10 Essential (primary) hypertension: Secondary | ICD-10-CM

## 2022-05-24 ENCOUNTER — Other Ambulatory Visit: Payer: Self-pay | Admitting: Internal Medicine

## 2022-05-24 DIAGNOSIS — I10 Essential (primary) hypertension: Secondary | ICD-10-CM

## 2022-12-27 ENCOUNTER — Encounter (INDEPENDENT_AMBULATORY_CARE_PROVIDER_SITE_OTHER): Payer: Self-pay

## 2023-01-28 ENCOUNTER — Other Ambulatory Visit: Payer: Self-pay | Admitting: Internal Medicine

## 2023-01-28 ENCOUNTER — Telehealth: Payer: BC Managed Care – PPO | Admitting: Nurse Practitioner

## 2023-01-28 DIAGNOSIS — J0140 Acute pansinusitis, unspecified: Secondary | ICD-10-CM

## 2023-01-28 MED ORDER — IPRATROPIUM BROMIDE 0.03 % NA SOLN: 2.0000 | Freq: Two times a day (BID) | NASAL | 12 refills | Status: AC

## 2023-01-28 MED ORDER — AMOXICILLIN-POT CLAVULANATE 875-125 MG PO TABS: 1.0000 | ORAL_TABLET | Freq: Two times a day (BID) | ORAL | 0 refills | Status: AC

## 2023-01-28 NOTE — Progress Notes (Signed)
Virtual Visit Consent   [name], you are scheduled for a virtual visit with a [facility] provider today. Just as with appointments in the office, your consent must be obtained to participate. Your consent will be active for this visit and any virtual visit you may have with one of our providers in the next 365 days. If you have a MyChart account, a copy of this consent can be sent to you electronically.  As this is a virtual visit, video technology does not allow for your provider to perform a traditional examination. This may limit your provider's ability to fully assess your condition. If your provider identifies any concerns that need to be evaluated in person or the need to arrange testing (such as labs, EKG, etc.), we will make arrangements to do so. Although advances in technology are sophisticated, we cannot ensure that it will always work on either your end or our end. If the connection with a video visit is poor, the visit may have to be switched to a telephone visit. With either a video or telephone visit, we are not always able to ensure that we have a secure connection.  By engaging in this virtual visit, you consent to the provision of healthcare and authorize for your insurance to be billed (if applicable) for the services provided during this visit. Depending on your insurance coverage, you may receive a charge related to this service.  I need to obtain your verbal consent now. Are you willing to proceed with your visit today? Hayley [name] has provided verbal consent on 01/28/2023 for a virtual visit (video or telephone). Viviano Simas, FNP  Date: 01/28/2023 9:07 AM  Virtual Visit via Video Note   I, Viviano Simas, connected with  Hayley [name]  ([id number], [birth year]) on 01/28/23 at  9:15 AM EDT by a video-enabled telemedicine application and verified that I am speaking with the correct person using two identifiers.  Location: Patient: Virtual Visit Location Patient:  Home Provider: Virtual Visit Location Provider: Home Office   I discussed the limitations of evaluation and management by telemedicine and the availability of in person appointments. The patient expressed understanding and agreed to proceed.    History of Present Illness: Hayley [name] is a 42 y.o. who identifies as a female who was assigned female at birth, and is being seen today for complaints of headache, sinus congestion that is causing her to have some dizziness   Symptom onset was last week, started with runny nose and has stopped having as much sinus drainage now she just feel congested, has a headache with sinus pressure and has vertigo as well.   She has had sinus infections in the past with similar symptoms   She has been using sudafed OTC for relief  History + for HTN   She does not monitor her BP at home   Denies chance of pregnancy or breastfeeding   She has been able to eat and drink   Problems:  Patient Active Problem List   Diagnosis Date Noted   Routine general medical examination at a health care facility 03/04/2018   Essential hypertension 04/16/2017    Allergies:  Allergies  Allergen Reactions   Percocet [Oxycodone-Acetaminophen] Nausea Only   Prednisone Other (See Comments)    Possible blood pressure spike   Medications:  Current Outpatient Medications:    amLODipine (NORVASC) 10 MG tablet, TAKE 1 TABLET BY MOUTH EVERY DAY, Disp: 90 tablet, Rfl: 2   amoxicillin-clavulanate (AUGMENTIN) 875-125  MG tablet, Take 1 tablet by mouth 2 (two) times daily., Disp: 14 tablet, Rfl: 0   fluticasone (FLONASE) 50 MCG/ACT nasal spray, Place 2 sprays into both nostrils daily., Disp: 16 g, Rfl: 0   lisinopril-hydrochlorothiazide (ZESTORETIC) 10-12.5 MG tablet, TAKE 1 TABLET BY MOUTH EVERY DAY, Disp: 90 tablet, Rfl: 3  Observations/Objective: Patient is well-developed, well-nourished in no acute distress.  Resting comfortably  at home.  Head is normocephalic, atraumatic.   No labored breathing.  Speech is clear and coherent with logical content.  Patient is alert and oriented at baseline.    Assessment and Plan:  1. Acute non-recurrent pansinusitis  - amoxicillin-clavulanate (AUGMENTIN) 875-125 MG tablet; Take 1 tablet by mouth 2 (two) times daily for 7 days.  Dispense: 14 tablet; Refill: 0 - ipratropium (ATROVENT) 0.03 % nasal spray; Place 2 sprays into both nostrils every 12 (twelve) hours.  Dispense: 30 mL; Refill: 12    Stop sudafed may use Coricidin HBP OTC for relief of congestion  Push fluids  Assure food with antibiotics   Follow Up Instructions: I discussed the assessment and treatment plan with the patient. The patient was provided an opportunity to ask questions and all were answered. The patient agreed with the plan and demonstrated an understanding of the instructions.  A copy of instructions were sent to the patient via MyChart unless otherwise noted below.    The patient was advised to call back or seek an in-person evaluation if the symptoms worsen or if the condition fails to improve as anticipated.  Time:  I spent 10 minutes with the patient via telehealth technology discussing the above problems/concerns.    Viviano Simas, FNP

## 2023-01-31 ENCOUNTER — Ambulatory Visit: Payer: BC Managed Care – PPO | Admitting: Internal Medicine

## 2023-02-22 ENCOUNTER — Other Ambulatory Visit: Payer: Self-pay | Admitting: Internal Medicine

## 2023-02-22 DIAGNOSIS — I10 Essential (primary) hypertension: Secondary | ICD-10-CM

## 2023-03-04 ENCOUNTER — Encounter: Payer: Self-pay | Admitting: Internal Medicine

## 2023-03-04 ENCOUNTER — Ambulatory Visit: Payer: BC Managed Care – PPO | Admitting: Internal Medicine

## 2023-03-04 ENCOUNTER — Ambulatory Visit: Payer: BC Managed Care – PPO | Admitting: Family Medicine

## 2023-03-04 VITALS — BP 124/80 | HR 92 | Temp 98.4°F | Ht 61.0 in | Wt 160.0 lb

## 2023-03-04 DIAGNOSIS — J189 Pneumonia, unspecified organism: Secondary | ICD-10-CM | POA: Diagnosis not present

## 2023-03-04 MED ORDER — AZITHROMYCIN 250 MG PO TABS: ORAL_TABLET | ORAL | 0 refills | Status: AC

## 2023-03-04 MED ORDER — PROMETHAZINE-DM 6.25-15 MG/5ML PO SYRP: 5.0000 mL | ORAL_SOLUTION | Freq: Four times a day (QID) | ORAL | 0 refills | Status: DC | PRN

## 2023-03-04 NOTE — Patient Instructions (Addendum)
We have sent in azithromycin to take 2 pills today, then 1 pill daily until gone.  We have sent in cough medicine for you to use up to 3 times a day.

## 2023-03-04 NOTE — Assessment & Plan Note (Signed)
Suspect cap given productive cough not improving 2 weeks into course. Rx azithromycin and promethazine/dm cough syrup. If no improvement would change antibiotic or do CXR.

## 2023-03-04 NOTE — Progress Notes (Signed)
   Subjective:   Patient ID: Hayley [name], female    DOB: 1981-01-23, 42 y.o.   MRN: [id number]  Cough Associated symptoms include chills, myalgias, postnasal drip, rhinorrhea and shortness of breath. Pertinent negatives include no ear pain, fever, sore throat or wheezing.   The patient is a 42 YO female coming in for sore throat and cough. Started 2 weeks ago. New loss of voice in last few days. Went to urgent care about 1 week ago and given steroid pack which did not help at all.   Review of Systems  Constitutional:  Positive for activity change, appetite change and chills. Negative for fatigue, fever and unexpected weight change.  HENT:  Positive for congestion, postnasal drip, rhinorrhea and sinus pressure. Negative for ear discharge, ear pain, sinus pain, sneezing, sore throat, tinnitus, trouble swallowing and voice change.   Eyes: Negative.   Respiratory:  Positive for cough and shortness of breath. Negative for chest tightness and wheezing.   Cardiovascular: Negative.   Gastrointestinal: Negative.   Musculoskeletal:  Positive for myalgias.  Neurological: Negative.     Objective:  Physical Exam Constitutional:      Appearance: She is well-developed.  HENT:     Head: Normocephalic and atraumatic.     Comments: Oropharynx with redness and clear drainage, nose with swollen turbinates, TMs normal bilaterally.  Neck:     Thyroid: No thyromegaly.  Cardiovascular:     Rate and Rhythm: Normal rate and regular rhythm.  Pulmonary:     Effort: Pulmonary effort is normal. No respiratory distress.     Breath sounds: Rhonchi present. No wheezing or rales.  Abdominal:     Palpations: Abdomen is soft.  Musculoskeletal:        General: Tenderness present.     Cervical back: Normal range of motion.  Lymphadenopathy:     Cervical: No cervical adenopathy.  Skin:    General: Skin is warm and dry.  Neurological:     Mental Status: She is alert and oriented to person, place, and time.      Vitals:   03/04/23 0947  BP: 124/80  Pulse: 92  Temp: 98.4 F (36.9 C)  TempSrc: Oral  SpO2: 95%  Weight: 160 lb (72.6 kg)  Height: 5\' 1"  (1.549 m)    Assessment & Plan:

## 2023-04-28 ENCOUNTER — Other Ambulatory Visit: Payer: Self-pay | Admitting: Internal Medicine

## 2023-04-28 DIAGNOSIS — I10 Essential (primary) hypertension: Secondary | ICD-10-CM

## 2023-07-05 ENCOUNTER — Other Ambulatory Visit: Payer: Self-pay | Admitting: Internal Medicine

## 2023-07-05 DIAGNOSIS — I10 Essential (primary) hypertension: Secondary | ICD-10-CM

## 2023-07-28 ENCOUNTER — Other Ambulatory Visit: Payer: Self-pay | Admitting: Internal Medicine

## 2023-10-10 ENCOUNTER — Other Ambulatory Visit: Payer: Self-pay | Admitting: Internal Medicine

## 2023-10-10 DIAGNOSIS — I10 Essential (primary) hypertension: Secondary | ICD-10-CM

## 2023-10-26 ENCOUNTER — Other Ambulatory Visit: Payer: Self-pay | Admitting: Internal Medicine

## 2024-01-09 ENCOUNTER — Other Ambulatory Visit: Payer: Self-pay | Admitting: Internal Medicine

## 2024-01-09 DIAGNOSIS — I10 Essential (primary) hypertension: Secondary | ICD-10-CM

## 2024-02-15 ENCOUNTER — Other Ambulatory Visit: Payer: Self-pay | Admitting: Internal Medicine

## 2024-02-18 ENCOUNTER — Ambulatory Visit: Admitting: Family Medicine

## 2024-02-18 ENCOUNTER — Ambulatory Visit: Admitting: Internal Medicine

## 2024-02-18 ENCOUNTER — Encounter: Payer: Self-pay | Admitting: Family Medicine

## 2024-02-18 VITALS — BP 120/88 | HR 76 | Temp 98.6°F | Ht 61.0 in | Wt 166.4 lb

## 2024-02-18 DIAGNOSIS — I10 Essential (primary) hypertension: Secondary | ICD-10-CM | POA: Diagnosis not present

## 2024-02-18 DIAGNOSIS — L2084 Intrinsic (allergic) eczema: Secondary | ICD-10-CM | POA: Diagnosis not present

## 2024-02-18 MED ORDER — TRIAMCINOLONE ACETONIDE 0.025 % EX OINT
1.0000 | TOPICAL_OINTMENT | Freq: Two times a day (BID) | CUTANEOUS | 0 refills | Status: AC
Start: 2024-02-18 — End: ?

## 2024-02-18 MED ORDER — LISINOPRIL-HYDROCHLOROTHIAZIDE 10-12.5 MG PO TABS: 1.0000 | ORAL_TABLET | Freq: Every day | ORAL | 2 refills | Status: AC

## 2024-02-18 MED ORDER — AMLODIPINE BESYLATE 10 MG PO TABS: 10.0000 mg | ORAL_TABLET | Freq: Every day | ORAL | 2 refills | Status: AC

## 2024-02-18 NOTE — Progress Notes (Signed)
 Established Patient Office Visit  Subjective:     Patient ID: Hayley [name], female    DOB: 1981/02/25, 43 y.o.   MRN: [id number]  Chief Complaint  Patient presents with   Medication Refill   Rash    HPI  Discussed the use of AI scribe software for clinical note transcription with the patient, who gave verbal consent to proceed.  History of Present Illness Hayley [name] is a 43 year old female with hypertension who presents for refills on blood pressure medications and evaluation of a persistent rash.  Hypertension management - Requires refills for blood pressure medications to be sent to [facility] at [address] - Does not monitor blood pressure at home - Last laboratory evaluation in August was normal  Pruritic dermatitis - Persistent rash since August, described as stress-induced eczema - Rash located on neck and under armpits, with intermittent spread to fingers and eyelids - Significant pruritus, especially at night, leading to scratching - Partial and temporary improvement with cortisone and other over-the-counter treatments; no significant sustained improvement - Inconsistent use of Zyrtec; occasional use of Benadryl      ROS Per HPI      Objective:    BP 120/88 (BP Location: Left Arm, Patient Position: Sitting)   Pulse 76   Temp 98.6 F (37 C) (Temporal)   Ht 5' 1 (1.549 m)   Wt 166 lb 6.4 oz (75.5 kg)   SpO2 98%   BMI 31.44 kg/m    Physical Exam Vitals and nursing note reviewed.  Constitutional:      General: She is not in acute distress.    Appearance: Normal appearance.  HENT:     Head: Normocephalic and atraumatic.     Right Ear: External ear normal.     Left Ear: External ear normal.     Nose: Nose normal.     Mouth/Throat:     Mouth: Mucous membranes are moist.     Pharynx: Oropharynx is clear.  Eyes:     Extraocular Movements: Extraocular movements intact.     Pupils: Pupils are equal, round, and reactive to light.  Cardiovascular:      Rate and Rhythm: Normal rate and regular rhythm.     Pulses: Normal pulses.     Heart sounds: Normal heart sounds.  Pulmonary:     Effort: Pulmonary effort is normal. No respiratory distress.     Breath sounds: Normal breath sounds. No wheezing, rhonchi or rales.  Musculoskeletal:        General: Normal range of motion.     Cervical back: Normal range of motion.     Right lower leg: No edema.     Left lower leg: No edema.  Lymphadenopathy:     Cervical: No cervical adenopathy.  Skin:    Comments: Maculopapular erythematous rash to flexural aspect of bilateral elbows, chest with no discharge, no bleeding  Neurological:     General: No focal deficit present.     Mental Status: She is alert and oriented to person, place, and time.  Psychiatric:        Mood and Affect: Mood normal.        Thought Content: Thought content normal.     No results found for any visits on 02/18/24.  The 10-year ASCVD risk score (Arnett DK, et al., 2019) is: 1.2%  BP Readings from Last 3 Encounters:  02/18/24 120/88  03/04/23 124/80  03/22/22 120/80   Wt Readings from Last 3 Encounters:  02/18/24  166 lb 6.4 oz (75.5 kg)  03/04/23 160 lb (72.6 kg)  03/22/22 163 lb (73.9 kg)      Last CBC Lab Results  Component Value Date   WBC 7.8 03/22/2022   HGB 13.8 03/22/2022   HCT 39.7 03/22/2022   MCV 96.7 03/22/2022   MCH 32.0 03/19/2017   RDW 13.0 03/22/2022   PLT 371.0 03/22/2022   Last metabolic panel Lab Results  Component Value Date   GLUCOSE 70 03/22/2022   NA 137 03/22/2022   K 3.8 03/22/2022   CL 102 03/22/2022   CO2 27 03/22/2022   BUN 20 03/22/2022   CREATININE 0.74 03/22/2022   GFR 99.14 03/22/2022   CALCIUM  9.9 03/22/2022   PROT 8.0 03/22/2022   ALBUMIN 4.8 03/22/2022   BILITOT 0.4 03/22/2022   ALKPHOS 56 03/22/2022   AST 22 03/22/2022   ALT 23 03/22/2022   ANIONGAP 11 03/19/2017   Last lipids Lab Results  Component Value Date   CHOL 261 (H) 03/22/2022   HDL  64.60 03/22/2022   LDLCALC 174 (H) 03/22/2022   LDLDIRECT 111.0 03/04/2018   TRIG 112.0 03/22/2022   CHOLHDL 4 03/22/2022         Assessment & Plan:   Assessment and Plan Assessment & Plan Intrinsic eczema Chronic stress-induced eczema flare on neck, armpits, potential spread to fingers and eyelids. Over-the-counter treatments insufficient. Stress may exacerbate symptoms through histamine release. - Prescribed triamcinolone  ointment. - Advised mixing triamcinolone  with moisturizer for application. - Recommended Zyrtec daily for histamine management. - Instructed to apply ointment with Vaseline or Aquaphor, especially in winter, and wear gloves overnight.  Essential hypertension Chronic condition requiring management. No recent home blood pressure readings. Recent labs normal, kidney function likely assessed during colonoscopy. - Sent prescription refills for blood pressure medication to [facility] [address].     No orders of the defined types were placed in this encounter.    Meds ordered this encounter  Medications   amLODipine  (NORVASC ) 10 MG tablet    Sig: Take 1 tablet (10 mg total) by mouth daily.    Dispense:  90 tablet    Refill:  2   lisinopril -hydrochlorothiazide  (ZESTORETIC ) 10-12.5 MG tablet    Sig: Take 1 tablet by mouth daily.    Dispense:  90 tablet    Refill:  2   triamcinolone  (KENALOG ) 0.025 % ointment    Sig: Apply 1 Application topically 2 (two) times daily.    Dispense:  30 g    Refill:  0    Return in about 6 months (around 08/18/2024).  Corean LITTIE Ku, FNP

## 2024-02-18 NOTE — Patient Instructions (Signed)
 Have refilled blood pressure medications.   I have sent in triamcinolone  cream for you to use to your pharmacy.  You may mix the triamcinolone  with equal parts Eucerin cream/Vaseline/Aquaphor and apply to affected area BID   May try zyrtec once daily for eczema  Follow-up with me in 6 mos for medication management, sooner if needed.

## 2024-05-02 ENCOUNTER — Telehealth: Admitting: Physician Assistant

## 2024-05-02 DIAGNOSIS — J111 Influenza due to unidentified influenza virus with other respiratory manifestations: Secondary | ICD-10-CM

## 2024-05-02 MED ORDER — AZELASTINE HCL 0.1 % NA SOLN: 2.0000 | Freq: Two times a day (BID) | NASAL | 12 refills | Status: AC

## 2024-05-02 MED ORDER — PROMETHAZINE-DM 6.25-15 MG/5ML PO SYRP: 5.0000 mL | ORAL_SOLUTION | Freq: Four times a day (QID) | ORAL | 0 refills | Status: AC | PRN

## 2024-05-02 MED ORDER — OSELTAMIVIR PHOSPHATE 75 MG PO CAPS: 75.0000 mg | ORAL_CAPSULE | Freq: Two times a day (BID) | ORAL | 0 refills | Status: AC

## 2024-05-02 MED ORDER — BENZONATATE 100 MG PO CAPS: 100.0000 mg | ORAL_CAPSULE | Freq: Two times a day (BID) | ORAL | 0 refills | Status: AC | PRN

## 2024-05-02 NOTE — Progress Notes (Signed)
 E visit for Flu like symptoms   We are sorry that you are not feeling well.  Here is how we plan to help! Based on what you have shared with me it looks like you may have possible exposure to a virus that causes influenza.  Influenza or the flu is  an infection caused by a respiratory virus. The flu virus is highly contagious and persons who did not receive their yearly flu vaccination may catch the flu from close contact.  We have anti-viral medications to treat the viruses that cause this infection. They are not a cure and only shorten the course of the infection. These prescriptions are most effective when they are given within the first 2 days of flu symptoms. Antiviral medications are indicated if you have a high risk of complications from the flu. You should  also consider an antiviral medication if you are in close contact with someone who is at risk. These medications can help patients avoid complications from the flu but have side effects that you should know.   Possible side effects from Tamiflu  or oseltamivir  include nausea, vomiting, diarrhea, dizziness, headaches, eye redness, sleep problems or other respiratory symptoms. You should not take Tamiflu  if you have an allergy to oseltamivir  or any to the ingredients in Tamiflu .  Based upon your symptoms and potential risk factors I have prescribed Oseltamivir  (Tamiflu ).  It has been sent to your designated pharmacy.  You will take one 75 mg capsule orally twice a day for the next 5 days.   For nasal congestion, you may use an oral decongestant such as Mucinex D or if you have glaucoma or high blood pressure use plain Mucinex.  Saline nasal spray or nasal drops can help and can safely be used as often as needed for congestion.  If you have a sore or scratchy throat, use a saltwater gargle-  to  teaspoon of salt dissolved in a 4-ounce to 8-ounce glass of warm water.  Gargle the solution for approximately 15-30 seconds and then spit.   It is important not to swallow the solution.  You can also use throat lozenges/cough drops and Chloraseptic spray to help with throat pain or discomfort.  Warm or cold liquids can also be helpful in relieving throat pain.  For headache, pain or general discomfort, you can use Ibuprofen  or Tylenol  as directed.   Some authorities believe that zinc sprays or the use of Echinacea may shorten the course of your symptoms.  I have prescribed the following medications to help lessen symptoms: I have prescribed Tessalon  Perles 100 mg. You may take 1-2 capsules every 8 hours as needed for cough, I have prescribed Phenergan  DM 6.25 mg/15 mg. You make take one teaspoon / 5 ml every 4-6 hours as needed for cough, and I have prescribed Fluticasone  nasal spray 2 sprays in each nostril one time per dayasal spray 2 sprays in each nostril one time per day  You are to isolate at home until you have been fever-free for at least 24 hours without a fever-reducing medication, and symptoms have been steadily improving for 24 hours.  If you must be around other household members who do not have symptoms, you need to make sure that both you and the family members are masking consistently with a high-quality mask.  If you note any worsening of symptoms despite treatment, please seek an in-person evaluation ASAP. If you note any significant shortness of breath or any chest pain, please seek ED evaluation. Please  do not delay care!  ANYONE WHO HAS FLU SYMPTOMS SHOULD: Stay home. The flu is highly contagious and going out or to work exposes others! Be sure to drink plenty of fluids. Water is fine as well as fruit juices, sodas and electrolyte beverages. You may want to stay away from caffeine or alcohol. If you are nauseated, try taking small sips of liquids. How do you know if you are getting enough fluid? Your urine should be a pale yellow or almost colorless. Get rest. Taking a steamy shower or using a humidifier may help  nasal congestion and ease sore throat pain. Using a saline nasal spray works much the same way. Cough drops, hard candies and sore throat lozenges may ease your cough. Line up a caregiver. Have someone check on you regularly.  GET HELP RIGHT AWAY IF: You cannot keep down liquids or your medications. You become short of breath Your fell like you are going to pass out or loose consciousness. Your symptoms persist after you have completed your treatment plan  MAKE SURE YOU  Understand these instructions. Will watch your condition. Will get help right away if you are not doing well or get worse.  Your e-visit answers were reviewed by a board certified advanced clinical practitioner to complete your personal care plan.  Depending on the condition, your plan could have included both over the counter or prescription medications.  If there is a problem please reply  once you have received a response from your provider.  Your safety is important to us .  If you have drug allergies check your prescription carefully.    You can use MyChart to ask questions about todays visit, request a non-urgent call back, or ask for a work or school excuse for 24 hours related to this e-Visit. If it has been greater than 24 hours you will need to follow up with your provider, or enter a new e-Visit to address those concerns.  You will get an e-mail in the next two days asking about your experience.  I hope that your e-visit has been valuable and will speed your recovery. Thank you for using e-visits.   I have spent 5 minutes in review of e-visit questionnaire, review and updating patient chart, medical decision making and response to patient.   Teena Shuck, PA-C
